# Patient Record
Sex: Female | Born: 1985 | Race: White | Hispanic: No | Marital: Married | Smoking: Former smoker
Health system: Southern US, Community
[De-identification: ages and names within clinical notes are randomized; demographics above are authoritative.]

## PROBLEM LIST (undated history)

## (undated) DIAGNOSIS — N83209 Unspecified ovarian cyst, unspecified side: Secondary | ICD-10-CM

## (undated) HISTORY — PX: OVARIAN CYST REMOVAL: SHX89

## (undated) HISTORY — PX: WISDOM TOOTH EXTRACTION: SHX21

## (undated) HISTORY — DX: Unspecified ovarian cyst, unspecified side: N83.209

---
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
## 2013-08-15 ENCOUNTER — Encounter: Payer: Self-pay | Admitting: Physical Medicine & Rehabilitation

## 2013-09-06 ENCOUNTER — Ambulatory Visit (HOSPITAL_BASED_OUTPATIENT_CLINIC_OR_DEPARTMENT_OTHER): Payer: Worker's Compensation | Admitting: Physical Medicine & Rehabilitation

## 2013-09-06 ENCOUNTER — Encounter: Payer: Worker's Compensation | Attending: Physical Medicine & Rehabilitation

## 2013-09-06 ENCOUNTER — Encounter: Payer: Self-pay | Admitting: Physical Medicine & Rehabilitation

## 2013-09-06 VITALS — BP 124/65 | HR 68 | Resp 14 | Ht 63.0 in | Wt 172.0 lb

## 2013-09-06 DIAGNOSIS — M76899 Other specified enthesopathies of unspecified lower limb, excluding foot: Secondary | ICD-10-CM | POA: Insufficient documentation

## 2013-09-06 DIAGNOSIS — R51 Headache: Secondary | ICD-10-CM | POA: Insufficient documentation

## 2013-09-06 DIAGNOSIS — S39012A Strain of muscle, fascia and tendon of lower back, initial encounter: Secondary | ICD-10-CM | POA: Insufficient documentation

## 2013-09-06 DIAGNOSIS — R519 Headache, unspecified: Secondary | ICD-10-CM | POA: Insufficient documentation

## 2013-09-06 DIAGNOSIS — R209 Unspecified disturbances of skin sensation: Secondary | ICD-10-CM | POA: Insufficient documentation

## 2013-09-06 DIAGNOSIS — S335XXA Sprain of ligaments of lumbar spine, initial encounter: Secondary | ICD-10-CM

## 2013-09-06 DIAGNOSIS — M545 Low back pain: Secondary | ICD-10-CM | POA: Insufficient documentation

## 2013-09-06 DIAGNOSIS — R279 Unspecified lack of coordination: Secondary | ICD-10-CM | POA: Insufficient documentation

## 2013-09-06 DIAGNOSIS — M7060 Trochanteric bursitis, unspecified hip: Secondary | ICD-10-CM | POA: Insufficient documentation

## 2013-09-06 DIAGNOSIS — M7061 Trochanteric bursitis, right hip: Secondary | ICD-10-CM

## 2013-09-06 MED ORDER — MELOXICAM 7.5 MG PO TABS: 7.5000 mg | ORAL_TABLET | Freq: Every day | ORAL | Status: DC

## 2013-09-06 MED ORDER — CYCLOBENZAPRINE HCL 10 MG PO TABS: 10.0000 mg | ORAL_TABLET | Freq: Every day | ORAL | Status: DC
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
## 2013-09-06 NOTE — Patient Instructions (Addendum)

## 2013-09-25 ENCOUNTER — Ambulatory Visit: Payer: Worker's Compensation | Admitting: Physical Medicine & Rehabilitation

## 2013-10-19 ENCOUNTER — Encounter: Payer: Self-pay | Admitting: Physical Medicine & Rehabilitation

## 2013-10-19 ENCOUNTER — Encounter: Payer: Worker's Compensation | Attending: Physical Medicine & Rehabilitation

## 2013-10-19 ENCOUNTER — Ambulatory Visit (HOSPITAL_BASED_OUTPATIENT_CLINIC_OR_DEPARTMENT_OTHER): Payer: Worker's Compensation | Admitting: Physical Medicine & Rehabilitation

## 2013-10-19 ENCOUNTER — Encounter (INDEPENDENT_AMBULATORY_CARE_PROVIDER_SITE_OTHER): Payer: Self-pay

## 2013-10-19 VITALS — BP 117/63 | HR 64 | Resp 14 | Ht 63.0 in | Wt 171.2 lb

## 2013-10-19 DIAGNOSIS — R51 Headache: Secondary | ICD-10-CM | POA: Insufficient documentation

## 2013-10-19 DIAGNOSIS — S39012D Strain of muscle, fascia and tendon of lower back, subsequent encounter: Secondary | ICD-10-CM

## 2013-10-19 DIAGNOSIS — R279 Unspecified lack of coordination: Secondary | ICD-10-CM | POA: Insufficient documentation

## 2013-10-19 DIAGNOSIS — R209 Unspecified disturbances of skin sensation: Secondary | ICD-10-CM | POA: Insufficient documentation

## 2013-10-19 DIAGNOSIS — Z5189 Encounter for other specified aftercare: Secondary | ICD-10-CM

## 2013-10-19 DIAGNOSIS — S335XXA Sprain of ligaments of lumbar spine, initial encounter: Secondary | ICD-10-CM | POA: Insufficient documentation

## 2013-10-19 DIAGNOSIS — M76899 Other specified enthesopathies of unspecified lower limb, excluding foot: Secondary | ICD-10-CM | POA: Insufficient documentation
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
## 2016-09-20 DIAGNOSIS — S161XXA Strain of muscle, fascia and tendon at neck level, initial encounter: Secondary | ICD-10-CM | POA: Diagnosis not present

## 2016-09-20 DIAGNOSIS — S134XXA Sprain of ligaments of cervical spine, initial encounter: Secondary | ICD-10-CM | POA: Diagnosis not present

## 2016-09-20 DIAGNOSIS — M546 Pain in thoracic spine: Secondary | ICD-10-CM | POA: Diagnosis not present

## 2016-09-20 DIAGNOSIS — S233XXA Sprain of ligaments of thoracic spine, initial encounter: Secondary | ICD-10-CM | POA: Diagnosis not present

## 2017-02-15 DIAGNOSIS — Z6829 Body mass index (BMI) 29.0-29.9, adult: Secondary | ICD-10-CM | POA: Diagnosis not present

## 2017-02-15 DIAGNOSIS — N926 Irregular menstruation, unspecified: Secondary | ICD-10-CM | POA: Diagnosis not present

## 2017-02-15 DIAGNOSIS — Z30011 Encounter for initial prescription of contraceptive pills: Secondary | ICD-10-CM | POA: Diagnosis not present

## 2017-03-29 DIAGNOSIS — S233XXA Sprain of ligaments of thoracic spine, initial encounter: Secondary | ICD-10-CM | POA: Diagnosis not present

## 2017-03-29 DIAGNOSIS — S134XXA Sprain of ligaments of cervical spine, initial encounter: Secondary | ICD-10-CM | POA: Diagnosis not present

## 2017-03-29 DIAGNOSIS — S338XXA Sprain of other parts of lumbar spine and pelvis, initial encounter: Secondary | ICD-10-CM | POA: Diagnosis not present

## 2017-05-17 DIAGNOSIS — L720 Epidermal cyst: Secondary | ICD-10-CM | POA: Diagnosis not present

## 2017-07-14 DIAGNOSIS — S233XXA Sprain of ligaments of thoracic spine, initial encounter: Secondary | ICD-10-CM | POA: Diagnosis not present

## 2017-07-14 DIAGNOSIS — S134XXA Sprain of ligaments of cervical spine, initial encounter: Secondary | ICD-10-CM | POA: Diagnosis not present

## 2017-07-14 DIAGNOSIS — S338XXA Sprain of other parts of lumbar spine and pelvis, initial encounter: Secondary | ICD-10-CM | POA: Diagnosis not present

## 2017-08-11 DIAGNOSIS — S134XXA Sprain of ligaments of cervical spine, initial encounter: Secondary | ICD-10-CM | POA: Diagnosis not present

## 2017-08-11 DIAGNOSIS — S233XXA Sprain of ligaments of thoracic spine, initial encounter: Secondary | ICD-10-CM | POA: Diagnosis not present

## 2017-08-11 DIAGNOSIS — S338XXA Sprain of other parts of lumbar spine and pelvis, initial encounter: Secondary | ICD-10-CM | POA: Diagnosis not present

## 2017-09-26 DIAGNOSIS — S134XXA Sprain of ligaments of cervical spine, initial encounter: Secondary | ICD-10-CM | POA: Diagnosis not present

## 2017-09-26 DIAGNOSIS — S233XXA Sprain of ligaments of thoracic spine, initial encounter: Secondary | ICD-10-CM | POA: Diagnosis not present

## 2017-09-26 DIAGNOSIS — S338XXA Sprain of other parts of lumbar spine and pelvis, initial encounter: Secondary | ICD-10-CM | POA: Diagnosis not present
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
## 2017-10-19 ENCOUNTER — Encounter: Payer: Self-pay | Admitting: Family Medicine

## 2017-10-19 ENCOUNTER — Ambulatory Visit (INDEPENDENT_AMBULATORY_CARE_PROVIDER_SITE_OTHER): Payer: BLUE CROSS/BLUE SHIELD | Admitting: Family Medicine

## 2017-10-19 VITALS — BP 107/65 | HR 74 | Temp 98.2°F | Ht 63.0 in | Wt 173.0 lb

## 2017-10-19 DIAGNOSIS — Z9889 Other specified postprocedural states: Secondary | ICD-10-CM

## 2017-10-19 DIAGNOSIS — Z6830 Body mass index (BMI) 30.0-30.9, adult: Secondary | ICD-10-CM | POA: Insufficient documentation

## 2017-10-19 DIAGNOSIS — Z7689 Persons encountering health services in other specified circumstances: Secondary | ICD-10-CM

## 2017-10-19 DIAGNOSIS — Z130 Encounter for screening for diseases of the blood and blood-forming organs and certain disorders involving the immune mechanism: Secondary | ICD-10-CM

## 2017-10-19 DIAGNOSIS — Z0000 Encounter for general adult medical examination without abnormal findings: Secondary | ICD-10-CM

## 2017-10-19 DIAGNOSIS — Z8742 Personal history of other diseases of the female genital tract: Secondary | ICD-10-CM

## 2017-10-19 DIAGNOSIS — R1031 Right lower quadrant pain: Secondary | ICD-10-CM | POA: Diagnosis not present

## 2017-10-19 DIAGNOSIS — Z2821 Immunization not carried out because of patient refusal: Secondary | ICD-10-CM

## 2017-10-19 LAB — MICROSCOPIC EXAMINATION
Bacteria, UA: NONE SEEN
RBC, UA: NONE SEEN /hpf (ref 0–?)
Renal Epithel, UA: NONE SEEN /hpf
WBC, UA: NONE SEEN /hpf (ref 0–?)

## 2017-10-19 LAB — URINALYSIS, COMPLETE
BILIRUBIN UA: NEGATIVE
Glucose, UA: NEGATIVE
KETONES UA: NEGATIVE
LEUKOCYTES UA: NEGATIVE
Nitrite, UA: NEGATIVE
Protein, UA: NEGATIVE
RBC UA: NEGATIVE
SPEC GRAV UA: 1.01 (ref 1.005–1.030)
Urobilinogen, Ur: 0.2 mg/dL (ref 0.2–1.0)
pH, UA: 7 (ref 5.0–7.5)

## 2017-10-19 LAB — PREGNANCY, URINE: PREG TEST UR: NEGATIVE

## 2017-10-19 NOTE — Patient Instructions (Signed)
We will go ahead and get an ultrasound to rule out ovarian cyst.  I have also ordered a urinalysis urine pregnancy to rule out infection and/or pregnancy as possible causes.  He will be contacted to schedule the ultrasound.  I do recommend going ahead and schedule an appointment with your gynecologist.  Get blood labs when you are fasting.  Health Maintenance, Female Adopting a healthy lifestyle and getting preventive care can go a long way to promote health and wellness. Talk with your health care provider about what schedule of regular examinations is right for you. This is a good chance for you to check in with your provider about disease prevention and staying healthy. In between checkups, there are plenty of things you can do on your own. Experts have done a lot of research about which lifestyle changes and preventive measures are most likely to keep you healthy. Ask your health care provider for more information. Weight and diet Eat a healthy diet  Be sure to include plenty of vegetables, fruits, low-fat dairy products, and lean protein.  Do not eat a lot of foods high in solid fats, added sugars, or salt.  Get regular exercise. This is one of the most important things you can do for your health. ? Most adults should exercise for at least 150 minutes each week. The exercise should increase your heart rate and make you sweat (moderate-intensity exercise). ? Most adults should also do strengthening exercises at least twice a week. This is in addition to the moderate-intensity exercise.  Maintain a healthy weight  Body mass index (BMI) is a measurement that can be used to identify possible weight problems. It estimates body fat based on height and weight. Your health care provider can help determine your BMI and help you achieve or maintain a healthy weight.  For females 33 years of age and older: ? A BMI below 18.5 is considered underweight. ? A BMI of 18.5 to 24.9 is normal. ? A BMI of  25 to 29.9 is considered overweight. ? A BMI of 30 and above is considered obese.  Watch levels of cholesterol and blood lipids  You should start having your blood tested for lipids and cholesterol at 30 years of age, then have this test every 5 years.  You may need to have your cholesterol levels checked more often if: ? Your lipid or cholesterol levels are high. ? You are older than 30 years of age. ? You are at high risk for heart disease.  Cancer screening Lung Cancer  Lung cancer screening is recommended for adults 63-80 years old who are at high risk for lung cancer because of a history of smoking.  A yearly low-dose CT scan of the lungs is recommended for people who: ? Currently smoke. ? Have quit within the past 15 years. ? Have at least a 30-pack-year history of smoking. A pack year is smoking an average of one pack of cigarettes a day for 1 year.  Yearly screening should continue until it has been 15 years since you quit.  Yearly screening should stop if you develop a health problem that would prevent you from having lung cancer treatment.  Breast Cancer  Practice breast self-awareness. This means understanding how your breasts normally appear and feel.  It also means doing regular breast self-exams. Let your health care provider know about any changes, no matter how small.  If you are in your 20s or 30s, you should have a clinical breast exam (CBE)  by a health care provider every 1-3 years as part of a regular health exam.  If you are 4 or older, have a CBE every year. Also consider having a breast X-ray (mammogram) every year.  If you have a family history of breast cancer, talk to your health care provider about genetic screening.  If you are at high risk for breast cancer, talk to your health care provider about having an MRI and a mammogram every year.  Breast cancer gene (BRCA) assessment is recommended for women who have family members with BRCA-related  cancers. BRCA-related cancers include: ? Breast. ? Ovarian. ? Tubal. ? Peritoneal cancers.  Results of the assessment will determine the need for genetic counseling and BRCA1 and BRCA2 testing.  Cervical Cancer Your health care provider may recommend that you be screened regularly for cancer of the pelvic organs (ovaries, uterus, and vagina). This screening involves a pelvic examination, including checking for microscopic changes to the surface of your cervix (Pap test). You may be encouraged to have this screening done every 3 years, beginning at age 49.  For women ages 5-65, health care providers may recommend pelvic exams and Pap testing every 3 years, or they may recommend the Pap and pelvic exam, combined with testing for human papilloma virus (HPV), every 5 years. Some types of HPV increase your risk of cervical cancer. Testing for HPV may also be done on women of any age with unclear Pap test results.  Other health care providers may not recommend any screening for nonpregnant women who are considered low risk for pelvic cancer and who do not have symptoms. Ask your health care provider if a screening pelvic exam is right for you.  If you have had past treatment for cervical cancer or a condition that could lead to cancer, you need Pap tests and screening for cancer for at least 20 years after your treatment. If Pap tests have been discontinued, your risk factors (such as having a new sexual partner) need to be reassessed to determine if screening should resume. Some women have medical problems that increase the chance of getting cervical cancer. In these cases, your health care provider may recommend more frequent screening and Pap tests.  Colorectal Cancer  This type of cancer can be detected and often prevented.  Routine colorectal cancer screening usually begins at 30 years of age and continues through 30 years of age.  Your health care provider may recommend screening at an  earlier age if you have risk factors for colon cancer.  Your health care provider may also recommend using home test kits to check for hidden blood in the stool.  A small camera at the end of a tube can be used to examine your colon directly (sigmoidoscopy or colonoscopy). This is done to check for the earliest forms of colorectal cancer.  Routine screening usually begins at age 53.  Direct examination of the colon should be repeated every 5-10 years through 30 years of age. However, you may need to be screened more often if early forms of precancerous polyps or small growths are found.  Skin Cancer  Check your skin from head to toe regularly.  Tell your health care provider about any new moles or changes in moles, especially if there is a change in a mole's shape or color.  Also tell your health care provider if you have a mole that is larger than the size of a pencil eraser.  Always use sunscreen. Apply sunscreen liberally and  repeatedly throughout the day.  Protect yourself by wearing long sleeves, pants, a wide-brimmed hat, and sunglasses whenever you are outside.  Heart disease, diabetes, and high blood pressure  High blood pressure causes heart disease and increases the risk of stroke. High blood pressure is more likely to develop in: ? People who have blood pressure in the high end of the normal range (130-139/85-89 mm Hg). ? People who are overweight or obese. ? People who are African American.  If you are 33-38 years of age, have your blood pressure checked every 3-5 years. If you are 76 years of age or older, have your blood pressure checked every year. You should have your blood pressure measured twice--once when you are at a hospital or clinic, and once when you are not at a hospital or clinic. Record the average of the two measurements. To check your blood pressure when you are not at a hospital or clinic, you can use: ? An automated blood pressure machine at a  pharmacy. ? A home blood pressure monitor.  If you are between 78 years and 36 years old, ask your health care provider if you should take aspirin to prevent strokes.  Have regular diabetes screenings. This involves taking a blood sample to check your fasting blood sugar level. ? If you are at a normal weight and have a low risk for diabetes, have this test once every three years after 30 years of age. ? If you are overweight and have a high risk for diabetes, consider being tested at a younger age or more often. Preventing infection Hepatitis B  If you have a higher risk for hepatitis B, you should be screened for this virus. You are considered at high risk for hepatitis B if: ? You were born in a country where hepatitis B is common. Ask your health care provider which countries are considered high risk. ? Your parents were born in a high-risk country, and you have not been immunized against hepatitis B (hepatitis B vaccine). ? You have HIV or AIDS. ? You use needles to inject street drugs. ? You live with someone who has hepatitis B. ? You have had sex with someone who has hepatitis B. ? You get hemodialysis treatment. ? You take certain medicines for conditions, including cancer, organ transplantation, and autoimmune conditions.  Hepatitis C  Blood testing is recommended for: ? Everyone born from [birth year] through 1964. ? Anyone with known risk factors for hepatitis C.  Sexually transmitted infections (STIs)  You should be screened for sexually transmitted infections (STIs) including gonorrhea and chlamydia if: ? You are sexually active and are younger than 30 years of age. ? You are older than 30 years of age and your health care provider tells you that you are at risk for this type of infection. ? Your sexual activity has changed since you were last screened and you are at an increased risk for chlamydia or gonorrhea. Ask your health care provider if you are at risk.  If you do not  have HIV, but are at risk, it may be recommended that you take a prescription medicine daily to prevent HIV infection. This is called pre-exposure prophylaxis (PrEP). You are considered at risk if: ? You are sexually active and do not regularly use condoms or know the HIV status of your partner(s). ? You take drugs by injection. ? You are sexually active with a partner who has HIV.  Talk with your health care provider about whether you  are at high risk of being infected with HIV. If you choose to begin PrEP, you should first be tested for HIV. You should then be tested every 3 months for as long as you are taking PrEP. Pregnancy  If you are premenopausal and you may become pregnant, ask your health care provider about preconception counseling.  If you may become pregnant, take 400 to 800 micrograms (mcg) of folic acid every day.  If you want to prevent pregnancy, talk to your health care provider about birth control (contraception). Osteoporosis and menopause  Osteoporosis is a disease in which the bones lose minerals and strength with aging. This can result in serious bone fractures. Your risk for osteoporosis can be identified using a bone density scan.  If you are 5 years of age or older, or if you are at risk for osteoporosis and fractures, ask your health care provider if you should be screened.  Ask your health care provider whether you should take a calcium or vitamin D supplement to lower your risk for osteoporosis.  Menopause may have certain physical symptoms and risks.  Hormone replacement therapy may reduce some of these symptoms and risks. Talk to your health care provider about whether hormone replacement therapy is right for you. Follow these instructions at home:  Schedule regular health, dental, and eye exams.  Stay current with your immunizations.  Do not use any tobacco products including cigarettes, chewing tobacco, or electronic cigarettes.  If you are pregnant,  do not drink alcohol.  If you are breastfeeding, limit how much and how often you drink alcohol.  Limit alcohol intake to no more than 1 drink per day for nonpregnant women. One drink equals 12 ounces of beer, 5 ounces of wine, or 1 ounces of hard liquor.  Do not use street drugs.  Do not share needles.  Ask your health care provider for help if you need support or information about quitting drugs.  Tell your health care provider if you often feel depressed.  Tell your health care provider if you have ever been abused or do not feel safe at home. This information is not intended to replace advice given to you by your health care provider. Make sure you discuss any questions you have with your health care provider. Document Released: 06/28/2011 Document Revised: 05/20/2016 Document Reviewed: 09/16/2015 Elsevier Interactive Patient Education  [employer].

## 2017-10-25 ENCOUNTER — Ambulatory Visit (HOSPITAL_COMMUNITY): Payer: BLUE CROSS/BLUE SHIELD

## 2018-01-16 DIAGNOSIS — S233XXA Sprain of ligaments of thoracic spine, initial encounter: Secondary | ICD-10-CM | POA: Diagnosis not present

## 2018-01-16 DIAGNOSIS — S134XXA Sprain of ligaments of cervical spine, initial encounter: Secondary | ICD-10-CM | POA: Diagnosis not present

## 2018-01-16 DIAGNOSIS — S338XXA Sprain of other parts of lumbar spine and pelvis, initial encounter: Secondary | ICD-10-CM | POA: Diagnosis not present

## 2018-01-17 DIAGNOSIS — Z01419 Encounter for gynecological examination (general) (routine) without abnormal findings: Secondary | ICD-10-CM | POA: Diagnosis not present

## 2018-01-17 DIAGNOSIS — Z6832 Body mass index (BMI) 32.0-32.9, adult: Secondary | ICD-10-CM | POA: Diagnosis not present

## 2018-01-17 DIAGNOSIS — Z3009 Encounter for other general counseling and advice on contraception: Secondary | ICD-10-CM | POA: Diagnosis not present

## 2018-02-07 DIAGNOSIS — Z30014 Encounter for initial prescription of intrauterine contraceptive device: Secondary | ICD-10-CM | POA: Diagnosis not present

## 2018-02-07 DIAGNOSIS — Z3202 Encounter for pregnancy test, result negative: Secondary | ICD-10-CM | POA: Diagnosis not present

## 2018-02-07 DIAGNOSIS — Z30430 Encounter for insertion of intrauterine contraceptive device: Secondary | ICD-10-CM | POA: Diagnosis not present

## 2018-02-13 DIAGNOSIS — S338XXA Sprain of other parts of lumbar spine and pelvis, initial encounter: Secondary | ICD-10-CM | POA: Diagnosis not present

## 2018-02-13 DIAGNOSIS — S134XXA Sprain of ligaments of cervical spine, initial encounter: Secondary | ICD-10-CM | POA: Diagnosis not present

## 2018-02-13 DIAGNOSIS — S233XXA Sprain of ligaments of thoracic spine, initial encounter: Secondary | ICD-10-CM | POA: Diagnosis not present

## 2018-03-10 DIAGNOSIS — Z6830 Body mass index (BMI) 30.0-30.9, adult: Secondary | ICD-10-CM | POA: Diagnosis not present

## 2018-03-10 DIAGNOSIS — Z30431 Encounter for routine checking of intrauterine contraceptive device: Secondary | ICD-10-CM | POA: Diagnosis not present

## 2018-03-16 DIAGNOSIS — S338XXA Sprain of other parts of lumbar spine and pelvis, initial encounter: Secondary | ICD-10-CM | POA: Diagnosis not present

## 2018-03-16 DIAGNOSIS — S233XXA Sprain of ligaments of thoracic spine, initial encounter: Secondary | ICD-10-CM | POA: Diagnosis not present

## 2018-03-16 DIAGNOSIS — S134XXA Sprain of ligaments of cervical spine, initial encounter: Secondary | ICD-10-CM | POA: Diagnosis not present

## 2018-04-13 DIAGNOSIS — S134XXA Sprain of ligaments of cervical spine, initial encounter: Secondary | ICD-10-CM | POA: Diagnosis not present

## 2018-04-13 DIAGNOSIS — S338XXA Sprain of other parts of lumbar spine and pelvis, initial encounter: Secondary | ICD-10-CM | POA: Diagnosis not present

## 2018-04-13 DIAGNOSIS — S233XXA Sprain of ligaments of thoracic spine, initial encounter: Secondary | ICD-10-CM | POA: Diagnosis not present

## 2018-05-11 DIAGNOSIS — S233XXA Sprain of ligaments of thoracic spine, initial encounter: Secondary | ICD-10-CM | POA: Diagnosis not present

## 2018-05-11 DIAGNOSIS — S338XXA Sprain of other parts of lumbar spine and pelvis, initial encounter: Secondary | ICD-10-CM | POA: Diagnosis not present

## 2018-05-11 DIAGNOSIS — S134XXA Sprain of ligaments of cervical spine, initial encounter: Secondary | ICD-10-CM | POA: Diagnosis not present

## 2018-06-01 DIAGNOSIS — S233XXA Sprain of ligaments of thoracic spine, initial encounter: Secondary | ICD-10-CM | POA: Diagnosis not present

## 2018-06-01 DIAGNOSIS — S338XXA Sprain of other parts of lumbar spine and pelvis, initial encounter: Secondary | ICD-10-CM | POA: Diagnosis not present

## 2018-06-01 DIAGNOSIS — S134XXA Sprain of ligaments of cervical spine, initial encounter: Secondary | ICD-10-CM | POA: Diagnosis not present

## 2018-07-05 DIAGNOSIS — S233XXA Sprain of ligaments of thoracic spine, initial encounter: Secondary | ICD-10-CM | POA: Diagnosis not present

## 2018-07-05 DIAGNOSIS — S338XXA Sprain of other parts of lumbar spine and pelvis, initial encounter: Secondary | ICD-10-CM | POA: Diagnosis not present

## 2018-07-05 DIAGNOSIS — S134XXA Sprain of ligaments of cervical spine, initial encounter: Secondary | ICD-10-CM | POA: Diagnosis not present

## 2018-08-03 DIAGNOSIS — S338XXA Sprain of other parts of lumbar spine and pelvis, initial encounter: Secondary | ICD-10-CM | POA: Diagnosis not present

## 2018-08-03 DIAGNOSIS — S134XXA Sprain of ligaments of cervical spine, initial encounter: Secondary | ICD-10-CM | POA: Diagnosis not present

## 2018-08-03 DIAGNOSIS — S233XXA Sprain of ligaments of thoracic spine, initial encounter: Secondary | ICD-10-CM | POA: Diagnosis not present

## 2018-08-17 DIAGNOSIS — S233XXA Sprain of ligaments of thoracic spine, initial encounter: Secondary | ICD-10-CM | POA: Diagnosis not present

## 2018-08-17 DIAGNOSIS — S134XXA Sprain of ligaments of cervical spine, initial encounter: Secondary | ICD-10-CM | POA: Diagnosis not present

## 2018-08-17 DIAGNOSIS — S338XXA Sprain of other parts of lumbar spine and pelvis, initial encounter: Secondary | ICD-10-CM | POA: Diagnosis not present

## 2018-08-31 DIAGNOSIS — S134XXA Sprain of ligaments of cervical spine, initial encounter: Secondary | ICD-10-CM | POA: Diagnosis not present

## 2018-08-31 DIAGNOSIS — S338XXA Sprain of other parts of lumbar spine and pelvis, initial encounter: Secondary | ICD-10-CM | POA: Diagnosis not present

## 2018-08-31 DIAGNOSIS — S233XXA Sprain of ligaments of thoracic spine, initial encounter: Secondary | ICD-10-CM | POA: Diagnosis not present

## 2018-09-28 DIAGNOSIS — S134XXA Sprain of ligaments of cervical spine, initial encounter: Secondary | ICD-10-CM | POA: Diagnosis not present

## 2018-09-28 DIAGNOSIS — S233XXA Sprain of ligaments of thoracic spine, initial encounter: Secondary | ICD-10-CM | POA: Diagnosis not present

## 2018-09-28 DIAGNOSIS — S338XXA Sprain of other parts of lumbar spine and pelvis, initial encounter: Secondary | ICD-10-CM | POA: Diagnosis not present

## 2018-11-10 DIAGNOSIS — S233XXA Sprain of ligaments of thoracic spine, initial encounter: Secondary | ICD-10-CM | POA: Diagnosis not present

## 2018-11-10 DIAGNOSIS — S134XXA Sprain of ligaments of cervical spine, initial encounter: Secondary | ICD-10-CM | POA: Diagnosis not present

## 2018-11-10 DIAGNOSIS — S338XXA Sprain of other parts of lumbar spine and pelvis, initial encounter: Secondary | ICD-10-CM | POA: Diagnosis not present

## 2018-11-14 ENCOUNTER — Encounter: Payer: Self-pay | Admitting: Family Medicine

## 2018-11-14 ENCOUNTER — Ambulatory Visit (INDEPENDENT_AMBULATORY_CARE_PROVIDER_SITE_OTHER): Payer: BLUE CROSS/BLUE SHIELD | Admitting: Family Medicine

## 2018-11-14 VITALS — BP 124/70 | HR 66 | Temp 98.3°F | Ht 63.0 in | Wt 166.0 lb

## 2018-11-14 DIAGNOSIS — H81312 Aural vertigo, left ear: Secondary | ICD-10-CM

## 2018-11-14 MED ORDER — MECLIZINE HCL 12.5 MG PO TABS: 12.5000 mg | ORAL_TABLET | Freq: Three times a day (TID) | ORAL | 0 refills | Status: DC | PRN

## 2018-11-14 NOTE — Patient Instructions (Signed)
Continue doing the maneuver to the left for vertigo.  Again, there was no evidence of infection or trauma to the inner ear.  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  I have sent in a small quantity of meclizine to use if needed.  Make sure you are staying hydrated.  Avoid aggravating activities.  We discussed that if symptoms do not continue to be better or do not totally resolve it may be a good idea to see the vestibular physical therapist.  Vertigo Vertigo is the feeling that you or your surroundings are moving when they are not. Vertigo can be dangerous if it occurs while you are doing something that could endanger you or others, such as driving. What are the causes? This condition is caused by a disturbance in the signals that are sent by your bodys sensory systems to your brain. Different causes of a disturbance can lead to vertigo, including:  Infections, especially in the inner ear.  A bad reaction to a drug, or misuse of alcohol and medicines.  Withdrawal from drugs or alcohol.  Quickly changing positions, as when lying down or rolling over in bed.  Migraine headaches.  Decreased blood flow to the brain.  Decreased blood pressure.  Increased pressure in the brain from a head or neck injury, stroke, infection, tumor, or bleeding.  Central nervous system disorders.  What are the signs or symptoms? Symptoms of this condition usually occur when you move your head or your eyes in different directions. Symptoms may start suddenly, and they usually last for less than a minute. Symptoms may include:  Loss of balance and falling.  Feeling like you are spinning or moving.  Feeling like your surroundings are spinning or moving.  Nausea and vomiting.  Blurred vision or double vision.  Difficulty hearing.  Slurred speech.  Dizziness.  Involuntary eye movement (nystagmus).  Symptoms can be mild and cause only slight annoyance, or they can be severe and  interfere with daily life. Episodes of vertigo may return (recur) over time, and they are often triggered by certain movements. Symptoms may improve over time. How is this diagnosed? This condition may be diagnosed based on medical history and the quality of your nystagmus. Your health care provider may test your eye movements by asking you to quickly change positions to trigger the nystagmus. This may be called the Dix-Hallpike test, head thrust test, or roll test. You may be referred to a health care provider who specializes in ear, nose, and throat (ENT) problems (otolaryngologist) or a provider who specializes in disorders of the central nervous system (neurologist). You may have additional testing, including:  A physical exam.  Blood tests.  MRI.  A CT scan.  An electrocardiogram (ECG). This records electrical activity in your heart.  An electroencephalogram (EEG). This records electrical activity in your brain.  Hearing tests.  How is this treated? Treatment for this condition depends on the cause and the severity of the symptoms. Treatment options include:  Medicines to treat nausea or vertigo. These are usually used for severe cases. Some medicines that are used to treat other conditions may also reduce or eliminate vertigo symptoms. These include: ? Medicines that control allergies (antihistamines). ? Medicines that control seizures (anticonvulsants). ? Medicines that relieve depression (antidepressants). ? Medicines that relieve anxiety (sedatives).  Head movements to adjust your inner ear back to normal. If your vertigo is caused by an ear problem, your health care provider may recommend certain movements to correct the problem.  Surgery. This is rare.  Follow these instructions at home: Safety  Move slowly.Avoid sudden body or head movements.  Avoid driving.  Avoid operating heavy machinery.  Avoid doing any tasks that would cause danger to you or others if you  would have a vertigo episode during the task.  If you have trouble walking or keeping your balance, try using a cane for stability. If you feel dizzy or unstable, sit down right away.  Return to your normal activities as told by your health care provider. Ask your health care provider what activities are safe for you. General instructions  Take over-the-counter and prescription medicines only as told by your health care provider.  Avoid certain positions or movements as told by your health care provider.  Drink enough fluid to keep your urine clear or pale yellow.  Keep all follow-up visits as told by your health care provider. This is important. Contact a health care provider if:  Your medicines do not relieve your vertigo or they make it worse.  You have a fever.  Your condition gets worse or you develop new symptoms.  Your family or friends notice any behavioral changes.  Your nausea or vomiting gets worse.  You have numbness or a pins and needles sensation in part of your body. Get help right away if:  You have difficulty moving or speaking.  You are always dizzy.  You faint.  You develop severe headaches.  You have weakness in your hands, arms, or legs.  You have changes in your hearing or vision.  You develop a stiff neck.  You develop sensitivity to light. This information is not intended to replace advice given to you by your health care provider. Make sure you discuss any questions you have with your health care provider. Document Released: 09/22/2005 Document Revised: 05/26/2016 Document Reviewed: 04/07/2015 Elsevier Interactive Patient Education  [employer].

## 2018-11-14 NOTE — Progress Notes (Signed)
Subjective: CC: Vertigo PCP: Raliegh Ip, DO QIO:NGEX Prowant is a 32 y.o. female presenting to clinic today for:  1. Vertigo Patient reports onset of vertigo about a week ago.  She notes that it seemed to be bilateral with left being worse than right but she has been performing the Epley maneuver at home and the right has totally improved.  The left is improving but still present.  She wanted to make sure that she does not have any infection ongoing in the left ear or trauma to the left ear.  She reports being very physically active and has had some impact on the left side of the head during sparring.  She notes that she wears headgear.  Denies any visual disturbance, nausea, vomiting.  She has had some balance problems but this resolves after she is able to acclimate the body for a few minutes.  She saw a chiropractor who attempted to perform the Epley as well.   ROS: Per HPI  Allergies  Allergen Reactions  . Azithromycin Nausea And Vomiting  . Demerol [Meperidine] Nausea And Vomiting   Past Medical History:  Diagnosis Date  . Ovarian cyst    right side   No current outpatient medications on file. Social History   Socioeconomic History  . Marital status: Married    Spouse name: Not on file  . Number of children: Not on file  . Years of education: Not on file  . Highest education level: Not on file  Occupational History  . Not on file  Social Needs  . Financial resource strain: Not on file  . Food insecurity:    Worry: Not on file    Inability: Not on file  . Transportation needs:    Medical: Not on file    Non-medical: Not on file  Tobacco Use  . Smoking status: Former Smoker    Packs/day: 0.50    Years: 5.00    Pack years: 2.50    Types: Cigarettes    Last attempt to quit: 2013    Years since quitting: 6.8  . Smokeless tobacco: Never Used  Substance and Sexual Activity  . Alcohol use: Yes    Comment: occassionally  . Drug use: No  . Sexual  activity: Yes    Birth control/protection: Condom  Lifestyle  . Physical activity:    Days per week: Not on file    Minutes per session: Not on file  . Stress: Not on file  Relationships  . Social connections:    Talks on phone: Not on file    Gets together: Not on file    Attends religious service: Not on file    Active member of club or organization: Not on file    Attends meetings of clubs or organizations: Not on file    Relationship status: Not on file  . Intimate partner violence:    Fear of current or ex partner: Not on file    Emotionally abused: Not on file    Physically abused: Not on file    Forced sexual activity: Not on file  Other Topics Concern  . Not on file  Social History Narrative  . Not on file   Family History  Problem Relation Age of Onset  . Heart disease Maternal Grandmother 60  . Hyperlipidemia Maternal Grandmother   . Hypertension Maternal Grandmother   . Diabetes Maternal Grandmother   . Alcohol abuse Paternal Grandmother   . Heart disease Paternal Grandfather  Objective: Office vital signs reviewed. BP 124/70   Pulse 66   Temp 98.3 F (36.8 C) (Oral)   Ht 5\' 3"  (1.6 m)   Wt 166 lb (75.3 kg)   BMI 29.41 kg/m   Physical Examination:  General: Awake, alert, well nourished, No acute distress HEENT: Normal    Neck: No masses palpated. No lymphadenopathy    Ears: Tympanic membranes intact, normal light reflex, no erythema, no bulging; no hemotympanum.  Scant dry skin in the left external auditory canal.    Eyes: PERRLA, extraocular membranes intact, sclera white Neuro: No focal neurologic deficits.  No nystagmus noted.  Assessment/ Plan: 32 y.o. female   1. 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  I have given her the handout for Epley maneuver to perform for the left side.  Push oral fluids.  Use meclizine.  Since symptoms are improving, will hold off on referral to vestibular rehab.  However, we discussed reasons to call or be  seen again.  She voiced good understanding of follow-up PRN.   No orders of the defined types were placed in this encounter.  Meds ordered this encounter  Medications  . meclizine (ANTIVERT) 12.5 MG tablet    Sig: Take 1-2 tablets (12.5-25 mg total) by mouth 3 (three) times daily as needed for dizziness.    Dispense:  30 tablet    Refill:  0     Danaya Geddis Hulen Skains, DO [facility] [phone]

## 2018-12-01 DIAGNOSIS — S134XXA Sprain of ligaments of cervical spine, initial encounter: Secondary | ICD-10-CM | POA: Diagnosis not present

## 2018-12-01 DIAGNOSIS — S233XXA Sprain of ligaments of thoracic spine, initial encounter: Secondary | ICD-10-CM | POA: Diagnosis not present

## 2018-12-01 DIAGNOSIS — S338XXA Sprain of other parts of lumbar spine and pelvis, initial encounter: Secondary | ICD-10-CM | POA: Diagnosis not present

## 2019-01-10 DIAGNOSIS — L700 Acne vulgaris: Secondary | ICD-10-CM | POA: Diagnosis not present

## 2019-01-26 DIAGNOSIS — S338XXA Sprain of other parts of lumbar spine and pelvis, initial encounter: Secondary | ICD-10-CM | POA: Diagnosis not present

## 2019-01-26 DIAGNOSIS — S134XXA Sprain of ligaments of cervical spine, initial encounter: Secondary | ICD-10-CM | POA: Diagnosis not present

## 2019-01-26 DIAGNOSIS — S233XXA Sprain of ligaments of thoracic spine, initial encounter: Secondary | ICD-10-CM | POA: Diagnosis not present

## 2019-02-13 DIAGNOSIS — S134XXA Sprain of ligaments of cervical spine, initial encounter: Secondary | ICD-10-CM | POA: Diagnosis not present

## 2019-02-13 DIAGNOSIS — S233XXA Sprain of ligaments of thoracic spine, initial encounter: Secondary | ICD-10-CM | POA: Diagnosis not present

## 2019-02-13 DIAGNOSIS — S338XXA Sprain of other parts of lumbar spine and pelvis, initial encounter: Secondary | ICD-10-CM | POA: Diagnosis not present

## 2019-04-26 DIAGNOSIS — S134XXA Sprain of ligaments of cervical spine, initial encounter: Secondary | ICD-10-CM | POA: Diagnosis not present

## 2019-04-26 DIAGNOSIS — S338XXA Sprain of other parts of lumbar spine and pelvis, initial encounter: Secondary | ICD-10-CM | POA: Diagnosis not present

## 2019-04-26 DIAGNOSIS — S233XXA Sprain of ligaments of thoracic spine, initial encounter: Secondary | ICD-10-CM | POA: Diagnosis not present

## 2019-05-10 DIAGNOSIS — S338XXA Sprain of other parts of lumbar spine and pelvis, initial encounter: Secondary | ICD-10-CM | POA: Diagnosis not present

## 2019-05-10 DIAGNOSIS — S134XXA Sprain of ligaments of cervical spine, initial encounter: Secondary | ICD-10-CM | POA: Diagnosis not present

## 2019-05-10 DIAGNOSIS — S233XXA Sprain of ligaments of thoracic spine, initial encounter: Secondary | ICD-10-CM | POA: Diagnosis not present

## 2019-05-29 DIAGNOSIS — S134XXA Sprain of ligaments of cervical spine, initial encounter: Secondary | ICD-10-CM | POA: Diagnosis not present

## 2019-05-29 DIAGNOSIS — S338XXA Sprain of other parts of lumbar spine and pelvis, initial encounter: Secondary | ICD-10-CM | POA: Diagnosis not present

## 2019-05-29 DIAGNOSIS — S233XXA Sprain of ligaments of thoracic spine, initial encounter: Secondary | ICD-10-CM | POA: Diagnosis not present

## 2019-05-31 DIAGNOSIS — S233XXA Sprain of ligaments of thoracic spine, initial encounter: Secondary | ICD-10-CM | POA: Diagnosis not present

## 2019-05-31 DIAGNOSIS — S134XXA Sprain of ligaments of cervical spine, initial encounter: Secondary | ICD-10-CM | POA: Diagnosis not present

## 2019-05-31 DIAGNOSIS — S338XXA Sprain of other parts of lumbar spine and pelvis, initial encounter: Secondary | ICD-10-CM | POA: Diagnosis not present

## 2019-06-22 DIAGNOSIS — S233XXA Sprain of ligaments of thoracic spine, initial encounter: Secondary | ICD-10-CM | POA: Diagnosis not present

## 2019-06-22 DIAGNOSIS — S338XXA Sprain of other parts of lumbar spine and pelvis, initial encounter: Secondary | ICD-10-CM | POA: Diagnosis not present

## 2019-06-22 DIAGNOSIS — S134XXA Sprain of ligaments of cervical spine, initial encounter: Secondary | ICD-10-CM | POA: Diagnosis not present

## 2019-07-10 DIAGNOSIS — S233XXA Sprain of ligaments of thoracic spine, initial encounter: Secondary | ICD-10-CM | POA: Diagnosis not present

## 2019-07-10 DIAGNOSIS — S134XXA Sprain of ligaments of cervical spine, initial encounter: Secondary | ICD-10-CM | POA: Diagnosis not present

## 2019-07-10 DIAGNOSIS — S338XXA Sprain of other parts of lumbar spine and pelvis, initial encounter: Secondary | ICD-10-CM | POA: Diagnosis not present

## 2019-07-19 DIAGNOSIS — S233XXA Sprain of ligaments of thoracic spine, initial encounter: Secondary | ICD-10-CM | POA: Diagnosis not present

## 2019-07-19 DIAGNOSIS — S338XXA Sprain of other parts of lumbar spine and pelvis, initial encounter: Secondary | ICD-10-CM | POA: Diagnosis not present

## 2019-07-19 DIAGNOSIS — S134XXA Sprain of ligaments of cervical spine, initial encounter: Secondary | ICD-10-CM | POA: Diagnosis not present

## 2019-08-06 DIAGNOSIS — S134XXA Sprain of ligaments of cervical spine, initial encounter: Secondary | ICD-10-CM | POA: Diagnosis not present

## 2019-08-06 DIAGNOSIS — S233XXA Sprain of ligaments of thoracic spine, initial encounter: Secondary | ICD-10-CM | POA: Diagnosis not present

## 2019-08-06 DIAGNOSIS — S338XXA Sprain of other parts of lumbar spine and pelvis, initial encounter: Secondary | ICD-10-CM | POA: Diagnosis not present

## 2019-09-21 DIAGNOSIS — Z30431 Encounter for routine checking of intrauterine contraceptive device: Secondary | ICD-10-CM | POA: Diagnosis not present

## 2019-09-21 DIAGNOSIS — N921 Excessive and frequent menstruation with irregular cycle: Secondary | ICD-10-CM | POA: Diagnosis not present

## 2019-09-21 DIAGNOSIS — Z6830 Body mass index (BMI) 30.0-30.9, adult: Secondary | ICD-10-CM | POA: Diagnosis not present

## 2019-10-09 DIAGNOSIS — Z30431 Encounter for routine checking of intrauterine contraceptive device: Secondary | ICD-10-CM | POA: Diagnosis not present

## 2019-10-09 DIAGNOSIS — N83202 Unspecified ovarian cyst, left side: Secondary | ICD-10-CM | POA: Diagnosis not present

## 2019-10-09 DIAGNOSIS — Z6829 Body mass index (BMI) 29.0-29.9, adult: Secondary | ICD-10-CM | POA: Diagnosis not present

## 2019-10-09 DIAGNOSIS — N83292 Other ovarian cyst, left side: Secondary | ICD-10-CM | POA: Diagnosis not present

## 2019-10-09 DIAGNOSIS — N921 Excessive and frequent menstruation with irregular cycle: Secondary | ICD-10-CM | POA: Diagnosis not present

## 2019-12-17 ENCOUNTER — Ambulatory Visit (INDEPENDENT_AMBULATORY_CARE_PROVIDER_SITE_OTHER): Payer: Self-pay | Admitting: Family

## 2019-12-17 ENCOUNTER — Encounter: Payer: Self-pay | Admitting: Family

## 2019-12-17 DIAGNOSIS — M5441 Lumbago with sciatica, right side: Secondary | ICD-10-CM

## 2019-12-17 MED ORDER — GABAPENTIN 100 MG PO CAPS: 100.0000 mg | ORAL_CAPSULE | Freq: Three times a day (TID) | ORAL | 3 refills | Status: DC

## 2019-12-17 MED ORDER — DICLOFENAC SODIUM 75 MG PO TBEC: 75.0000 mg | DELAYED_RELEASE_TABLET | Freq: Two times a day (BID) | ORAL | 0 refills | Status: DC

## 2019-12-17 MED ORDER — PREDNISONE 10 MG (21) PO TBPK: ORAL_TABLET | ORAL | 0 refills | Status: DC

## 2019-12-17 NOTE — Progress Notes (Signed)
Virtual Visit via telephone Note Due to COVID-19 pandemic this visit was conducted virtually. This visit type was conducted due to national recommendations for restrictions regarding the COVID-19 Pandemic (e.g. social distancing, sheltering in place) in an effort to limit this patient's exposure and mitigate transmission in our community. All issues noted in this document were discussed and addressed.  A physical exam was not performed with this format.  I connected with Tabitha Trujillo on 12/17/19 at 2:17 pm by telephone and verified that I am speaking with the correct person using two identifiers. Tabitha Trujillo is currently located at home and son is currently with her during visit. The provider, Evelina Dun, FNP is located in their office at time of visit.  I discussed the limitations, risks, security and privacy concerns of performing an evaluation and management service by telephone and the availability of in person appointments. I also discussed with the patient that there may be a patient responsible charge related to this service. The patient expressed understanding and agreed to proceed.   History and Present Illness:  Back Pain This is a new problem. The current episode started 1 to 4 weeks ago. The problem occurs intermittently. The problem has been waxing and waning since onset. The pain is present in the gluteal. The quality of the pain is described as aching. The pain radiates to the right thigh. The pain is at a severity of 10/10. The pain is moderate. The symptoms are aggravated by standing, twisting and bending. Associated symptoms include leg pain and tingling. Pertinent negatives include no bladder incontinence, bowel incontinence, fever or weakness. She has tried chiropractic manipulation, NSAIDs, bed rest and ice for the symptoms. The treatment provided mild relief.      Review of Systems  Constitutional: Negative for fever.  Gastrointestinal: Negative for bowel  incontinence.  Genitourinary: Negative for bladder incontinence.  Musculoskeletal: Positive for back pain.  Neurological: Positive for tingling. Negative for weakness.  All other systems reviewed and are negative.    Observations/Objective: No SOB or distress noted  Assessment and Plan: 1. Acute right-sided low back pain with right-sided sciatica Rest ROM exercises encouraged No other NSAID's while taking Voltaren Continue Chiropractor Call if symptoms worsen or do not improve   - predniSONE (STERAPRED UNI-PAK 21 TAB) 10 MG (21) TBPK tablet; Use as directed  Dispense: 21 tablet; Refill: 0 - diclofenac (VOLTAREN) 75 MG EC tablet; Take 1 tablet (75 mg total) by mouth 2 (two) times daily.  Dispense: 30 tablet; Refill: 0 - gabapentin (NEURONTIN) 100 MG capsule; Take 1 capsule (100 mg total) by mouth 3 (three) times daily.  Dispense: 90 capsule; Refill: 3     I discussed the assessment and treatment plan with the patient. The patient was provided an opportunity to ask questions and all were answered. The patient agreed with the plan and demonstrated an understanding of the instructions.   The patient was advised to call back or seek an in-person evaluation if the symptoms worsen or if the condition fails to improve as anticipated.  The above assessment and management plan was discussed with the patient. The patient verbalized understanding of and has agreed to the management plan. Patient is aware to call the clinic if symptoms persist or worsen. Patient is aware when to return to the clinic for a follow-up visit. Patient educated on when it is appropriate to go to the emergency department.   Time call ended:  2:27 pm  I provided 10 minutes of non-face-to-face time during  this encounter.    Evelina Dun, FNP

## 2020-01-03 ENCOUNTER — Other Ambulatory Visit: Payer: Self-pay

## 2020-01-04 ENCOUNTER — Ambulatory Visit (INDEPENDENT_AMBULATORY_CARE_PROVIDER_SITE_OTHER): Payer: BC Managed Care – PPO

## 2020-01-04 ENCOUNTER — Encounter: Payer: Self-pay | Admitting: Family Medicine

## 2020-01-04 ENCOUNTER — Ambulatory Visit (INDEPENDENT_AMBULATORY_CARE_PROVIDER_SITE_OTHER): Payer: BC Managed Care – PPO | Admitting: Family Medicine

## 2020-01-04 VITALS — BP 108/58 | HR 69 | Temp 97.8°F | Ht 63.0 in | Wt 173.6 lb

## 2020-01-04 DIAGNOSIS — M5441 Lumbago with sciatica, right side: Secondary | ICD-10-CM

## 2020-01-04 DIAGNOSIS — M545 Low back pain: Secondary | ICD-10-CM | POA: Diagnosis not present

## 2020-01-04 MED ORDER — DICLOFENAC SODIUM 1 % EX GEL: 2.0000 g | Freq: Four times a day (QID) | CUTANEOUS | 1 refills | Status: DC

## 2020-01-04 MED ORDER — KETOROLAC TROMETHAMINE 60 MG/2ML IM SOLN
60.0000 mg | Freq: Once | INTRAMUSCULAR | Status: AC
  Administered 2020-01-04: 60 mg via INTRAMUSCULAR

## 2020-01-04 MED ORDER — METHYLPREDNISOLONE ACETATE 40 MG/ML IJ SUSP
40.0000 mg | Freq: Once | INTRAMUSCULAR | Status: AC
  Administered 2020-01-04: 40 mg via INTRAMUSCULAR

## 2020-01-04 NOTE — Patient Instructions (Signed)
Sciatica  Sciatica is pain, weakness, tingling, or loss of feeling (numbness) along the sciatic nerve. The sciatic nerve starts in the lower back and goes down the back of each leg. Sciatica usually goes away on its own or with treatment. Sometimes, sciatica may come back (recur). What are the causes? This condition happens when the sciatic nerve is pinched or has pressure put on it. This may be the result of:  A disk in between the bones of the spine bulging out too far (herniated disk).  Changes in the spinal disks that occur with aging.  A condition that affects a muscle in the butt.  Extra bone growth near the sciatic nerve.  A break (fracture) of the area between your hip bones (pelvis).  Pregnancy.  Tumor. This is rare. What increases the risk? You are more likely to develop this condition if you:  Play sports that put pressure or stress on the spine.  Have poor strength and ease of movement (flexibility).  Have had a back injury in the past.  Have had back surgery.  Sit for long periods of time.  Do activities that involve bending or lifting over and over again.  Are very overweight (obese). What are the signs or symptoms? Symptoms can vary from mild to very bad. They may include:  Any of these problems in the lower back, leg, hip, or butt: ? Mild tingling, loss of feeling, or dull aches. ? Burning sensations. ? Sharp pains.  Loss of feeling in the back of the calf or the sole of the foot.  Leg weakness.  Very bad back pain that makes it hard to move. These symptoms may get worse when you cough, sneeze, or laugh. They may also get worse when you sit or stand for long periods of time. How is this treated? This condition often gets better without any treatment. However, treatment may include:  Changing or cutting back on physical activity when you have pain.  Doing exercises and stretching.  Putting ice or heat on the affected area.  Medicines that  help: ? To relieve pain and swelling. ? To relax your muscles.  Shots (injections) of medicines that help to relieve pain, irritation, and swelling.  Surgery. Follow these instructions at home: Medicines  Take over-the-counter and prescription medicines only as told by your doctor.  Ask your doctor if the medicine prescribed to you: ? Requires you to avoid driving or using heavy machinery. ? Can cause trouble pooping (constipation). You may need to take these steps to prevent or treat trouble pooping:  Drink enough fluids to keep your pee (urine) pale yellow.  Take over-the-counter or prescription medicines.  Eat foods that are high in fiber. These include beans, whole grains, and fresh fruits and vegetables.  Limit foods that are high in fat and sugar. These include fried or sweet foods. Managing pain      If told, put ice on the affected area. ? Put ice in a plastic bag. ? Place a towel between your skin and the bag. ? Leave the ice on for 20 minutes, 2-3 times a day.  If told, put heat on the affected area. Use the heat source that your doctor tells you to use, such as a moist heat pack or a heating pad. ? Place a towel between your skin and the heat source. ? Leave the heat on for 20-30 minutes. ? Remove the heat if your skin turns bright red. This is very important if you are   unable to feel pain, heat, or cold. You may have a greater risk of getting burned. Activity   Return to your normal activities as told by your doctor. Ask your doctor what activities are safe for you.  Avoid activities that make your symptoms worse.  Take short rests during the day. ? When you rest for a long time, do some physical activity or stretching between periods of rest. ? Avoid sitting for a long time without moving. Get up and move around at least one time each hour.  Exercise and stretch regularly, as told by your doctor.  Do not lift anything that is heavier than 10 lb (4.5 kg)  while you have symptoms of sciatica. ? Avoid lifting heavy things even when you do not have symptoms. ? Avoid lifting heavy things over and over.  When you lift objects, always lift in a way that is safe for your body. To do this, you should: ? Bend your knees. ? Keep the object close to your body. ? Avoid twisting. General instructions  Stay at a healthy weight.  Wear comfortable shoes that support your feet. Avoid wearing high heels.  Avoid sleeping on a mattress that is too soft or too hard. You might have less pain if you sleep on a mattress that is firm enough to support your back.  Keep all follow-up visits as told by your doctor. This is important. Contact a doctor if:  You have pain that: ? Wakes you up when you are sleeping. ? Gets worse when you lie down. ? Is worse than the pain you have had in the past. ? Lasts longer than 4 weeks.  You lose weight without trying. Get help right away if:  You cannot control when you pee (urinate) or poop (have a bowel movement).  You have weakness in any of these areas and it gets worse: ? Lower back. ? The area between your hip bones. ? Butt. ? Legs.  You have redness or swelling of your back.  You have a burning feeling when you pee. Summary  Sciatica is pain, weakness, tingling, or loss of feeling (numbness) along the sciatic nerve.  This condition happens when the sciatic nerve is pinched or has pressure put on it.  Sciatica can cause pain, tingling, or loss of feeling (numbness) in the lower back, legs, hips, and butt.  Treatment often includes rest, exercise, medicines, and putting ice or heat on the affected area. This information is not intended to replace advice given to you by your health care provider. Make sure you discuss any questions you have with your health care provider. Document Revised: 01/01/2019 Document Reviewed: 01/01/2019 Elsevier Patient Education  2020 [employer]  

## 2020-01-04 NOTE — Progress Notes (Signed)
Subjective:  Patient ID: Tabitha Trujillo, female    DOB: December 01, 1986, 34 y.o.   MRN: [id number]  Patient Care Team: Raliegh Ip, DO as PCP - General (Family Medicine)   Chief Complaint:  Leg Pain (She thinks its a pinched nerve)   HPI: Tabitha Trujillo is a 34 y.o. female presenting on 01/04/2020 for Leg Pain (She thinks its a pinched nerve)   Pt presents with ongoing right leg pain that starts in her right hip and radiates down her leg. This started several weeks ago and is getting worse. She has been seen by chiropractor and at this office for this pain. She was placed on steroids, gabapentin, and voltaren on 12/17/2019. States the voltaren and gabapentin made her feel bad so she stopped taking them. States the steroids did help for a bit but the pain returned. Pt states she has a hard time walking due to the pain in her leg. States weight bearing makes it worse. No loss of function, saddle anesthesia, bowel or bladder incontinence, or weakness. No recent injuries. No imaging to date.   Leg Pain  The incident occurred more than 1 week ago. There was no injury mechanism. The pain is present in the right hip, right thigh and right leg. The quality of the pain is described as aching, burning and shooting. The pain is at a severity of 7/10. The pain is moderate. The pain has been fluctuating since onset. Associated symptoms include tingling. Pertinent negatives include no inability to bear weight, loss of motion, loss of sensation, muscle weakness or numbness. The symptoms are aggravated by movement and weight bearing. She has tried NSAIDs (steroids, gabapentin) for the symptoms. The treatment provided no relief.    Relevant past medical, surgical, family, and social history reviewed and updated as indicated.  Allergies and medications reviewed and updated. Date reviewed: Chart in Epic.   Past Medical History:  Diagnosis Date  . Ovarian cyst    right side    Past  Surgical History:  Procedure Laterality Date  . OVARIAN CYST REMOVAL Right    at 34 yo    Social History   Socioeconomic History  . Marital status: Married    Spouse name: Not on file  . Number of children: Not on file  . Years of education: Not on file  . Highest education level: Not on file  Occupational History  . Not on file  Tobacco Use  . Smoking status: Former Smoker    Packs/day: 0.50    Years: 5.00    Pack years: 2.50    Types: Cigarettes    Quit date: 2013    Years since quitting: 8.0  . Smokeless tobacco: Never Used  Substance and Sexual Activity  . Alcohol use: Yes    Comment: occassionally  . Drug use: No  . Sexual activity: Yes    Birth control/protection: Condom  Other Topics Concern  . Not on file  Social History Narrative  . Not on file   Social Determinants of Health   Financial Resource Strain:   . Difficulty of Paying Living Expenses: Not on file  Food Insecurity:   . Worried About Programme researcher, broadcasting/film/video in the Last Year: Not on file  . Ran Out of Food in the Last Year: Not on file  Transportation Needs:   . Lack of Transportation (Medical): Not on file  . Lack of Transportation (Non-Medical): Not on file  Physical Activity:   . Days of Exercise  per Week: Not on file  . Minutes of Exercise per Session: Not on file  Stress:   . Feeling of Stress : Not on file  Social Connections:   . Frequency of Communication with Friends and Family: Not on file  . Frequency of Social Gatherings with Friends and Family: Not on file  . Attends Religious Services: Not on file  . Active Member of Clubs or Organizations: Not on file  . Attends Archivist Meetings: Not on file  . Marital Status: Not on file  Intimate Partner Violence:   . Fear of Current or Ex-Partner: Not on file  . Emotionally Abused: Not on file  . Physically Abused: Not on file  . Sexually Abused: Not on file    Outpatient Encounter Medications as of 01/04/2020  Medication Sig   . diclofenac Sodium (VOLTAREN) 1 % GEL Apply 2 g topically 4 (four) times daily.  . [DISCONTINUED] diclofenac (VOLTAREN) 75 MG EC tablet Take 1 tablet (75 mg total) by mouth 2 (two) times daily. (Patient not taking: Reported on 01/04/2020)  . [DISCONTINUED] gabapentin (NEURONTIN) 100 MG capsule Take 1 capsule (100 mg total) by mouth 3 (three) times daily. (Patient not taking: Reported on 01/04/2020)  . [DISCONTINUED] meclizine (ANTIVERT) 12.5 MG tablet Take 1-2 tablets (12.5-25 mg total) by mouth 3 (three) times daily as needed for dizziness.  . [DISCONTINUED] predniSONE (STERAPRED UNI-PAK 21 TAB) 10 MG (21) TBPK tablet Use as directed  . [EXPIRED] ketorolac (TORADOL) injection 60 mg   . [EXPIRED] methylPREDNISolone acetate (DEPO-MEDROL) injection 40 mg    No facility-administered encounter medications on file as of 01/04/2020.    Allergies  Allergen Reactions  . Azithromycin Nausea And Vomiting  . Demerol [Meperidine] Nausea And Vomiting    Review of Systems  Constitutional: Negative for activity change, appetite change, chills, diaphoresis, fatigue, fever and unexpected weight change.  HENT: Negative.   Eyes: Negative.   Respiratory: Negative for cough, chest tightness and shortness of breath.   Cardiovascular: Negative for chest pain, palpitations and leg swelling.  Gastrointestinal: Negative for abdominal pain, blood in stool, constipation, diarrhea, nausea and vomiting.  Endocrine: Negative.   Genitourinary: Negative for decreased urine volume, difficulty urinating, dysuria, frequency and urgency.  Musculoskeletal: Positive for arthralgias, gait problem and myalgias. Negative for neck pain and neck stiffness.  Skin: Negative.   Allergic/Immunologic: Negative.   Neurological: Positive for tingling. Negative for dizziness, numbness and headaches.  Hematological: Negative.   Psychiatric/Behavioral: Negative for agitation, confusion, hallucinations, sleep disturbance and suicidal ideas.   All other systems reviewed and are negative.       Objective:  BP (!) 108/58   Pulse 69   Temp 97.8 F (36.6 C) (Temporal)   Ht 5\' 3"  (1.6 m)   Wt 173 lb 9.6 oz (78.7 kg)   SpO2 100%   BMI 30.75 kg/m    Wt Readings from Last 3 Encounters:  01/04/20 173 lb 9.6 oz (78.7 kg)  11/14/18 166 lb (75.3 kg)  10/19/17 173 lb (78.5 kg)    Physical Exam Vitals and nursing note reviewed.  Constitutional:      General: She is not in acute distress.    Appearance: Normal appearance. She is well-developed and well-groomed. She is not ill-appearing, toxic-appearing or diaphoretic.  HENT:     Head: Normocephalic and atraumatic.     Jaw: There is normal jaw occlusion.     Right Ear: Hearing normal.     Left Ear: Hearing normal.  Nose: Nose normal.     Mouth/Throat:     Lips: Pink.     Mouth: Mucous membranes are moist.     Pharynx: Oropharynx is clear. Uvula midline.  Eyes:     General: Lids are normal.     Extraocular Movements: Extraocular movements intact.     Conjunctiva/sclera: Conjunctivae normal.     Pupils: Pupils are equal, round, and reactive to light.  Neck:     Thyroid: No thyroid mass, thyromegaly or thyroid tenderness.     Vascular: No carotid bruit or JVD.     Trachea: Trachea and phonation normal.  Cardiovascular:     Rate and Rhythm: Normal rate and regular rhythm.     Chest Wall: PMI is not displaced.     Pulses: Normal pulses.     Heart sounds: Normal heart sounds. No murmur. No friction rub. No gallop.   Pulmonary:     Effort: Pulmonary effort is normal. No respiratory distress.     Breath sounds: Normal breath sounds. No wheezing.  Abdominal:     General: Bowel sounds are normal. There is no distension or abdominal bruit.     Palpations: Abdomen is soft. There is no hepatomegaly or splenomegaly.     Tenderness: There is no abdominal tenderness. There is no right CVA tenderness or left CVA tenderness.     Hernia: No hernia is present.  Musculoskeletal:      Cervical back: Normal range of motion and neck supple.     Lumbar back: Normal.     Right hip: Tenderness present.     Right upper leg: Tenderness present.     Right knee: Normal.     Right lower leg: Normal. No edema.     Left lower leg: No edema.     Comments: Right SI joint pain  Lymphadenopathy:     Cervical: No cervical adenopathy.  Skin:    General: Skin is warm and dry.     Capillary Refill: Capillary refill takes less than 2 seconds.     Coloration: Skin is not cyanotic, jaundiced or pale.     Findings: No rash.  Neurological:     General: No focal deficit present.     Mental Status: She is alert and oriented to person, place, and time.     Cranial Nerves: Cranial nerves are intact. No cranial nerve deficit.     Sensory: Sensation is intact. No sensory deficit.     Motor: Motor function is intact. No weakness.     Coordination: Coordination is intact. Coordination normal.     Gait: Gait abnormal (antalgic, using crutches).     Deep Tendon Reflexes: Reflexes are normal and symmetric. Reflexes normal.  Psychiatric:        Attention and Perception: Attention and perception normal.        Mood and Affect: Mood and affect normal.        Speech: Speech normal.        Behavior: Behavior normal. Behavior is cooperative.        Thought Content: Thought content normal.        Cognition and Memory: Cognition and memory normal.        Judgment: Judgment normal.     Results for orders placed or performed in visit on 10/19/17  Microscopic Examination   URINE  Result Value Ref Range   WBC, UA None seen 0 - 5 /hpf   RBC, UA None seen 0 - 2 /hpf  Epithelial Cells (non renal) 0-10 0 - 10 /hpf   Renal Epithel, UA None seen None seen /hpf   Bacteria, UA None seen None seen/Few  Pregnancy, urine  Result Value Ref Range   Preg Test, Ur Negative Negative  Urinalysis, Complete  Result Value Ref Range   Specific Gravity, UA 1.010 1.005 - 1.030   pH, UA 7.0 5.0 - 7.5   Color,  UA Yellow Yellow   Appearance Ur Clear Clear   Leukocytes, UA Negative Negative   Protein, UA Negative Negative/Trace   Glucose, UA Negative Negative   Ketones, UA Negative Negative   RBC, UA Negative Negative   Bilirubin, UA Negative Negative   Urobilinogen, Ur 0.2 0.2 - 1.0 mg/dL   Nitrite, UA Negative Negative   Microscopic Examination See below:      X-Ray: lumbar spine: scoliosis, no disc space narrowing. No acute findings. Preliminary x-ray reading by Kari Baars, FNP-C, [facility].   Pertinent labs & imaging results that were available during my care of the patient were reviewed by me and considered in my medical decision making.  Assessment & Plan:  Tabitha Trujillo was seen today for leg pain.  Diagnoses and all orders for this visit:  Acute right-sided low back pain with right-sided sciatica Ongoing symptoms. Unable to tolerate oral NSAIDs and gabapentin. Will give IM Toradol and DepoMedrol today. Xray unremarkable. Topical Voltaren gel. Referral to PT. Follow up in 4-6 weeks or sooner if not improving.  -     DG Lumbar Spine 2-3 Views; Future -     Ambulatory referral to Physical Therapy -     diclofenac Sodium (VOLTAREN) 1 % GEL; Apply 2 g topically 4 (four) times daily. -     methylPREDNISolone acetate (DEPO-MEDROL) injection 40 mg -     ketorolac (TORADOL) injection 60 mg     Continue all other maintenance medications.  Follow up plan: Return in about 6 weeks (around 02/15/2020), or if symptoms worsen or fail to improve, for PCP.  Continue healthy lifestyle choices, including diet (rich in fruits, vegetables, and lean proteins, and low in salt and simple carbohydrates) and exercise (at least 30 minutes of moderate physical activity daily).  Educational handout given for sciatica  The above assessment and management plan was discussed with the patient. The patient verbalized understanding of and has agreed to the management plan. Patient is aware to call the clinic if they  develop any new symptoms or if symptoms persist or worsen. Patient is aware when to return to the clinic for a follow-up visit. Patient educated on when it is appropriate to go to the emergency department.   Kari Baars, FNP-C [facility] [phone]

## 2020-01-07 ENCOUNTER — Other Ambulatory Visit: Payer: Self-pay | Admitting: Family Medicine

## 2020-01-07 ENCOUNTER — Telehealth: Payer: Self-pay | Admitting: Family Medicine

## 2020-01-07 ENCOUNTER — Other Ambulatory Visit: Payer: Self-pay | Admitting: *Deleted

## 2020-01-07 DIAGNOSIS — M5441 Lumbago with sciatica, right side: Secondary | ICD-10-CM

## 2020-01-07 NOTE — Telephone Encounter (Signed)
Patient states that she has not had any significant improvement over the weekend.  Patient states that she is still having burning sensation, a lot of pain, trouble standing for long periods of time, trouble sleeping and trouble getting comfortable when sitting.

## 2020-01-07 NOTE — Progress Notes (Signed)
Patient aware.

## 2020-01-07 NOTE — Progress Notes (Signed)
I agree with Michelle's assessment.  Would at minimum pursue orthopedic evaluation/ referral.  If she wants to hold off on MRI until they assess her, I think this is reasonable.

## 2020-01-07 NOTE — Progress Notes (Signed)
Patient would like to go ahead with ortho referral. Patient would like to stay in [facility]

## 2020-01-07 NOTE — Telephone Encounter (Signed)
Can place order for MRI or refer to ortho. Does she have a preference of who she would like to see. Has she been to PT?

## 2020-01-07 NOTE — Progress Notes (Signed)
Will place referral

## 2020-01-09 ENCOUNTER — Ambulatory Visit: Payer: BC Managed Care – PPO | Attending: Family Medicine | Admitting: Physical Therapy

## 2020-01-09 ENCOUNTER — Encounter: Payer: Self-pay | Admitting: Physical Therapy

## 2020-01-09 ENCOUNTER — Other Ambulatory Visit: Payer: Self-pay

## 2020-01-09 DIAGNOSIS — M5441 Lumbago with sciatica, right side: Secondary | ICD-10-CM

## 2020-01-09 DIAGNOSIS — R293 Abnormal posture: Secondary | ICD-10-CM

## 2020-01-09 NOTE — Therapy (Signed)
[facility] [address] [place], [place], [address] Phone: [phone]   Fax:  [phone]  Physical Therapy Evaluation  Patient Details  Name: Tabitha Trujillo MRN: [id number] Date of Birth: June 11, 1986 Referring Provider (PT): Gilford Silvius, FNP-C   Encounter Date: 01/09/2020  PT End of Session - 01/09/20 1051    Visit Number  1    Number of Visits  12    Date for PT Re-Evaluation  02/27/20    Authorization Type  progress note every 10th visit    PT Start Time  0945    PT Stop Time  1028    PT Time Calculation (min)  43 min    Equipment Utilized During Treatment  --   axillary crutch   Activity Tolerance  Patient limited by pain    Behavior During Therapy  [facility] for tasks assessed/performed       Past Medical History:  Diagnosis Date  . Ovarian cyst    right side    Past Surgical History:  Procedure Laterality Date  . OVARIAN CYST REMOVAL Right    at 34 yo    There were no vitals filed for this visit.   Subjective Assessment - 01/09/20 1048    Subjective  COVID-19 screening performed upon arrival.Patient arrives to physical therapy with reports of severe right sided low back pain with sharp, shooting, pain down to right foot that began the last week of November 2020. Patient is unsure of mechanism of injury and reported a progression of pain and symptoms. Patient requires assistance for ADLs and home tasks from her husband due to pain. Patient reports pain increases with excessive walking, stretching, and bending. Patient ambulates with one crutch around the community for added support. Patient reports pain at worst as 10/10 and pain at best as 3/10. Patient's goals are to decrease pain, improve movement, improve ability to perform home and caregiving activities, and return to PLOF and exercise routine.    Pertinent History  unremarkable    Limitations  Sitting;Standing;Walking;House hold activities    How long can you  stand comfortably?  short periods    How long can you walk comfortably?  short periods    Diagnostic tests  x-ray: see imaging    Currently in Pain?  Yes    Pain Score  6     Pain Location  Back    Pain Orientation  Right    Pain Descriptors / Indicators  Shooting;Sharp;Throbbing;Tingling    Pain Type  Acute pain    Pain Radiating Towards  right foot    Pain Onset  More than a month ago    Pain Frequency  Constant    Aggravating Factors   excessive walking, stretching some bending    Pain Relieving Factors  sitting with pillow under leg    Effect of Pain on Daily Activities  driving standing, sleeping.         [facility] PT Assessment - 01/09/20 0001      Assessment   Medical Diagnosis  acute right-sided low back pain with right sided sciatica    Referring Provider (PT)  Gilford Silvius, FNP-C    Onset Date/Surgical Date  --   end of November 2020   Next MD Visit  n/a    Prior Therapy  no      Precautions   Precautions  None      Restrictions   Weight Bearing Restrictions  No      Balance Screen  Has the patient fallen in the past 6 months  No    Has the patient had a decrease in activity level because of a fear of falling?   No    Is the patient reluctant to leave their home because of a fear of falling?   No      Home Environment   Living Environment  Private residence    Living Arrangements  Spouse/significant other;Children      Prior Function   Level of Independence  Needs assistance with ADLs;Needs assistance with homemaking;Independent with household mobility with device      Posture/Postural Control   Posture/Postural Control  Postural limitations    Postural Limitations  Rounded Shoulders;Forward head;Flexed trunk;Decreased lumbar lordosis      ROM / Strength   AROM / PROM / Strength  AROM      AROM   Overall AROM   Due to pain    AROM Assessment Site  Lumbar    Lumbar Flexion  20" finger tip to floor    Lumbar Extension  5 degrees from neutral    Lumbar -  Right Side Bend  17.5" finger tip to floor    Lumbar - Left Side Bend  17" finger tip to floor   (+) throbbing, burning pain     Palpation   Palpation comment  very tender to palpation to R QL, upper gluteals and in piriformis. increased tone in QL and R glute. tenderness throughout hamstring      Special Tests    Special Tests  Lumbar    Lumbar Tests  Slump Test;Straight Leg Raise      Slump test   Findings  Positive    Side  Right      Straight Leg Raise   Findings  Positive    Side   Right      Transfers   Comments  very slow painful transitional movements from sitting to standing and for bed mobility       Ambulation/Gait   Assistive device  R Axillary Crutch    Gait Pattern  Step-to pattern;Step-through pattern;Decreased step length - left;Decreased stance time - right;Decreased stride length;Decreased weight shift to right;Right foot flat;Antalgic;Lateral trunk lean to left;Trunk flexed                Objective measurements completed on examination: See above findings.              PT Education - 01/09/20 1050    Education Details  Draw ins, glute sets, trunk extension    Person(s) Educated  Patient    Methods  Explanation;Demonstration;Handout    Comprehension  Verbalized understanding;Returned demonstration          PT Long Term Goals - 01/09/20 1052      PT LONG TERM GOAL #1   Title  Patient will be independent with HEP    Time  6    Period  Weeks    Status  New      PT LONG TERM GOAL #2   Title  Patient will report a centralization of right sided neurological symptoms to low back or report no neurological symptoms to reduce nerve irritation.    Time  6    Period  Weeks    Status  New      PT LONG TERM GOAL #3   Title  Patient will report ability to perform ADLs and home activities with low back pain less than or equal to 4/10  Time  6    Period  Weeks    Status  New      PT LONG TERM GOAL #4   Title  Patient will return to  exercise program with no right sided neurological symptoms and low back pain less than or equal to 4/10.    Time  6    Period  Weeks    Status  New             Plan - 01/09/20 1250    Clinical Impression Statement  Patient is a 34 year old female who presents to physical therapy with low back pain, neurological symptoms to right foot, decreased ROM, and decreased bilateral MMT when grossly assessed. Patient ambulates with a very cautious and antalgic gait pattern with use of R axillary crutch. Patient (+) for SLR test as well as (+) right Slump test indicating nerve involvement. Patient provided with HEP as was educated on L axillary crutch use during gait. Patient would benefit from skilled physical therapy to address deficits and address patient's goals.    Personal Factors and Comorbidities  Age;Fitness;Time since onset of injury/illness/exacerbation    Examination-Activity Limitations  Bed Mobility;Transfers;Stand;Locomotion Level    Examination-Participation Restrictions  Cleaning;Meal Prep;Driving;Laundry    Stability/Clinical Decision Making  Evolving/Moderate complexity    Clinical Decision Making  Low    Rehab Potential  Good    PT Frequency  2x / week    PT Duration  6 weeks    PT Treatment/Interventions  ADLs/Self Care Home Management;Gait training;Stair training;Functional mobility training;Therapeutic activities;Therapeutic exercise;Balance training;Neuromuscular re-education;Manual techniques;Moist Heat;Cryotherapy;Chemical engineer;Patient/family education;Passive range of motion;Dry needling    PT Next Visit Plan  gentle core, STW/M to QL and glutes, when tolerable extension based TEs and traction. Modalities PRN for pain relief.    Consulted and Agree with Plan of Care  Patient       Patient will benefit from skilled therapeutic intervention in order to improve the following deficits and impairments:  Abnormal gait,  Difficulty walking, Decreased activity tolerance, Decreased strength, Decreased range of motion, Pain, Postural dysfunction  Visit Diagnosis: Acute right-sided low back pain with right-sided sciatica  Abnormal posture     Problem List Patient Active Problem List   Diagnosis Date Noted  . History of ovarian cystectomy 10/19/2017  . BMI 30.0-30.9,adult 10/19/2017    Guss Bunde, PT, DPT 01/09/2020, 12:57 PM  [facility] [address] [place], [place], [address] Phone: [phone]   Fax:  [phone]  Name: Tabitha Trujillo MRN: [id number] Date of Birth: 09/14/1986

## 2020-01-10 ENCOUNTER — Ambulatory Visit: Payer: BC Managed Care – PPO | Admitting: Orthopaedic Surgery

## 2020-01-11 ENCOUNTER — Other Ambulatory Visit: Payer: Self-pay

## 2020-01-11 ENCOUNTER — Ambulatory Visit: Payer: BC Managed Care – PPO | Admitting: Physical Therapy

## 2020-01-11 DIAGNOSIS — M5441 Lumbago with sciatica, right side: Secondary | ICD-10-CM | POA: Diagnosis not present

## 2020-01-11 DIAGNOSIS — R293 Abnormal posture: Secondary | ICD-10-CM

## 2020-01-11 NOTE — Therapy (Signed)
[facility] [address] [place], [place], [address] Phone: [phone]   Fax:  [phone]  Physical Therapy Treatment  Patient Details  Name: Marinda Tyer MRN: [id number] Date of Birth: [birth year] Referring Provider (PT): Gilford Silvius, FNP-C   Encounter Date: 01/11/2020  PT End of Session - 01/11/20 1052    Visit Number  2    Number of Visits  12    Date for PT Re-Evaluation  02/27/20    Authorization Type  progress note every 10th visit    PT Start Time  0945    PT Stop Time  1033    PT Time Calculation (min)  48 min    Activity Tolerance  Patient limited by pain    Behavior During Therapy  [facility] for tasks assessed/performed       Past Medical History:  Diagnosis Date   Ovarian cyst    right side    Past Surgical History:  Procedure Laterality Date   OVARIAN CYST REMOVAL Right    at 34 yo    There were no vitals filed for this visit.  Subjective Assessment - 01/11/20 1303    Subjective  COVID-19 screening performed upon arrival. Patient reports about 5/10. States pain is worst in the morning.    Pertinent History  unremarkable    Limitations  Sitting;Standing;Walking;House hold activities    How long can you stand comfortably?  short periods    How long can you walk comfortably?  short periods    Diagnostic tests  x-ray: see imaging    Currently in Pain?  Yes    Pain Score  5     Pain Location  Back    Pain Orientation  Right    Pain Descriptors / Indicators  Shooting;Sharp;Throbbing;Tingling    Pain Type  Acute pain    Pain Onset  More than a month ago    Pain Frequency  Constant         [facility] PT Assessment - 01/11/20 0001      Assessment   Medical Diagnosis  acute right-sided low back pain with right sided sciatica    Referring Provider (PT)  Gilford Silvius, FNP-C    Next MD Visit  n/a    Prior Therapy  no                   [facility] Adult PT Treatment/Exercise - 01/11/20 0001       Exercises   Exercises  Lumbar      Lumbar Exercises: Aerobic   Nustep  Level 1 x10      Lumbar Exercises: Supine   Other Supine Lumbar Exercises  nerve glides 2x10 with pillow under LE      Modalities   Modalities  Electrical Stimulation      Electrical Stimulation   Electrical Stimulation Location  Right glute    Electrical Stimulation Action  IFC    Electrical Stimulation Parameters  80-150 hz x15 mins    Electrical Stimulation Goals  Pain      Manual Therapy   Manual Therapy  Soft tissue mobilization    Soft tissue mobilization  STW/M to right glute to reduce tone and pain in left sidelying.                  PT Long Term Goals - 01/09/20 1052      PT LONG TERM GOAL #1   Title  Patient will be independent with HEP    Time  6    Period  Weeks    Status  New      PT LONG TERM GOAL #2   Title  Patient will report a centralization of right sided neurological symptoms to low back or report no neurological symptoms to reduce nerve irritation.    Time  6    Period  Weeks    Status  New      PT LONG TERM GOAL #3   Title  Patient will report ability to perform ADLs and home activities with low back pain less than or equal to 4/10    Time  6    Period  Weeks    Status  New      PT LONG TERM GOAL #4   Title  Patient will return to exercise program with no right sided neurological symptoms and low back pain less than or equal to 4/10.    Time  6    Period  Weeks    Status  New            Plan - 01/11/20 1258    Clinical Impression Statement  Patient tolerated treatment fairly. Patient reported an increase of neurological symptoms when right LE was elevated for nerve glides. Patient was able to tolerate with pillow under the leg. Patient noted with increased tone to right glute with increase of discomfort when STW/M around SIJ region. Patient discussed use of TENs unit for pain relief. No adverse affects upon the removal of modalities.    Personal Factors  and Comorbidities  Age;Fitness;Time since onset of injury/illness/exacerbation    Examination-Activity Limitations  Bed Mobility;Transfers;Stand;Locomotion Level    Examination-Participation Restrictions  Cleaning;Meal Prep;Driving;Laundry    Stability/Clinical Decision Making  Evolving/Moderate complexity    Clinical Decision Making  Low    Rehab Potential  Good    PT Frequency  2x / week    PT Duration  6 weeks    PT Treatment/Interventions  ADLs/Self Care Home Management;Gait training;Stair training;Functional mobility training;Therapeutic activities;Therapeutic exercise;Balance training;Neuromuscular re-education;Manual techniques;Moist Heat;Cryotherapy;Herbalist;Patient/family education;Passive range of motion;Dry needling    PT Next Visit Plan  gentle core, STW/M to QL and glutes, when tolerable extension based TEs and traction. Modalities PRN for pain relief.    Consulted and Agree with Plan of Care  Patient       Patient will benefit from skilled therapeutic intervention in order to improve the following deficits and impairments:  Abnormal gait, Difficulty walking, Decreased activity tolerance, Decreased strength, Decreased range of motion, Pain, Postural dysfunction  Visit Diagnosis: Acute right-sided low back pain with right-sided sciatica  Abnormal posture     Problem List Patient Active Problem List   Diagnosis Date Noted   History of ovarian cystectomy 10/19/2017   BMI 30.0-30.9,adult 10/19/2017    Gabriela Eves, PT, DPT 01/11/2020, 1:12 PM  [facility] [place], [place], [address] Phone: [phone]   Fax:  [phone]  Name: Fey Coghill MRN: [id number] Date of Birth: 1985/12/31

## 2020-01-17 ENCOUNTER — Ambulatory Visit: Payer: BC Managed Care – PPO | Admitting: *Deleted

## 2020-01-17 ENCOUNTER — Other Ambulatory Visit: Payer: Self-pay

## 2020-01-17 DIAGNOSIS — R293 Abnormal posture: Secondary | ICD-10-CM

## 2020-01-17 DIAGNOSIS — M5441 Lumbago with sciatica, right side: Secondary | ICD-10-CM | POA: Diagnosis not present

## 2020-01-17 NOTE — Therapy (Signed)
[facility] [address] [place], [place], [address] Phone: [phone]   Fax:  [phone]  Physical Therapy Treatment  Patient Details  Name: Tabitha Trujillo MRN: [id number] Date of Birth: [birth year] Referring Provider (PT): Gilford Silvius, FNP-C   Encounter Date: 01/17/2020  PT End of Session - 01/17/20 1756    Visit Number  3    Number of Visits  12    Date for PT Re-Evaluation  02/27/20    Authorization Type  progress note every 10th visit    PT Start Time  1115    PT Stop Time  1206    PT Time Calculation (min)  51 min       Past Medical History:  Diagnosis Date  . Ovarian cyst    right side    Past Surgical History:  Procedure Laterality Date  . OVARIAN CYST REMOVAL Right    at 34 yo    There were no vitals filed for this visit.  Subjective Assessment - 01/17/20 1124    Subjective  COVID-19 screening performed upon arrival. Patient reports about 5-6/10. States pain is worst in the morning.    Pertinent History  unremarkable    Limitations  Sitting;Standing;Walking;House hold activities    How long can you stand comfortably?  short periods    How long can you walk comfortably?  short periods    Diagnostic tests  x-ray: see imaging    Currently in Pain?  Yes    Pain Score  6     Pain Location  Back    Pain Orientation  Right    Pain Descriptors / Indicators  Shooting;Sharp    Pain Onset  More than a month ago                       [facility] Adult PT Treatment/Exercise - 01/17/20 0001      Exercises   Exercises  Lumbar      Lumbar Exercises: Aerobic   Nustep  Level 1 x10      Lumbar Exercises: Prone   Other Prone Lumbar Exercises  prone over pillows with no decrease in L      Modalities   Modalities  Electrical Stimulation;Traction      Traction   Type of Traction  Lumbar    Min (lbs)  5    Max (lbs)  60    Hold Time  99    Rest Time  5    Time  10      Manual Therapy   Manual  Therapy  Soft tissue mobilization    Soft tissue mobilization  STW/M to right LB paras and SIJ ( very tender) to reduce tone and pain in prone over 2 pillows                  PT Long Term Goals - 01/09/20 1052      PT LONG TERM GOAL #1   Title  Patient will be independent with HEP    Time  6    Period  Weeks    Status  New      PT LONG TERM GOAL #2   Title  Patient will report a centralization of right sided neurological symptoms to low back or report no neurological symptoms to reduce nerve irritation.    Time  6    Period  Weeks    Status  New      PT LONG TERM GOAL #  3   Title  Patient will report ability to perform ADLs and home activities with low back pain less than or equal to 4/10    Time  6    Period  Weeks    Status  New      PT LONG TERM GOAL #4   Title  Patient will return to exercise program with no right sided neurological symptoms and low back pain less than or equal to 4/10.    Time  6    Period  Weeks    Status  New            Plan - 01/17/20 1125    Clinical Impression Statement  Pt arrived today doing fair. Her RT LE symtoms are about the same and LB very tight and tender. She was able to perform low level therex f/b STW to LB  RT side paras prone over two pillows. No change in RT LE symptoms with prone position, but slightly increased during STW to RT SIJ/buttocks area. Traction at 60#s was performed and tolerated well. No change in symptoms.    Examination-Activity Limitations  Bed Mobility;Transfers;Stand;Locomotion Level    Examination-Participation Restrictions  Cleaning;Meal Prep;Driving;Laundry    Stability/Clinical Decision Making  Evolving/Moderate complexity    Rehab Potential  Good    PT Frequency  2x / week    PT Duration  6 weeks    PT Treatment/Interventions  ADLs/Self Care Home Management;Gait training;Stair training;Functional mobility training;Therapeutic activities;Therapeutic exercise;Balance training;Neuromuscular  re-education;Manual techniques;Moist Heat;Cryotherapy;Herbalist;Patient/family education;Passive range of motion;Dry needling    PT Next Visit Plan  Assess Traction             gentle core, STW/M to QL and glutes, when tolerable extension based TEs and traction. Modalities PRN for pain relief.    Consulted and Agree with Plan of Care  Patient       Patient will benefit from skilled therapeutic intervention in order to improve the following deficits and impairments:  Abnormal gait, Difficulty walking, Decreased activity tolerance, Decreased strength, Decreased range of motion, Pain, Postural dysfunction  Visit Diagnosis: Acute right-sided low back pain with right-sided sciatica  Abnormal posture     Problem List Patient Active Problem List   Diagnosis Date Noted  . History of ovarian cystectomy 10/19/2017  . BMI 30.0-30.9,adult 10/19/2017    Tabitha Trujillo,CHRIS, PTA 01/17/2020, 6:02 PM  [facility] [place], [place], [address] Phone: [phone]   Fax:  [phone]  Name: Tabitha Trujillo MRN: [id number] Date of Birth: 08-Aug-1986

## 2020-01-18 ENCOUNTER — Encounter: Payer: Self-pay | Admitting: Family

## 2020-01-18 ENCOUNTER — Ambulatory Visit (INDEPENDENT_AMBULATORY_CARE_PROVIDER_SITE_OTHER): Payer: BC Managed Care – PPO | Admitting: Family

## 2020-01-18 DIAGNOSIS — M5441 Lumbago with sciatica, right side: Secondary | ICD-10-CM

## 2020-01-18 MED ORDER — BACLOFEN 10 MG PO TABS: 10.0000 mg | ORAL_TABLET | Freq: Three times a day (TID) | ORAL | 1 refills | Status: DC

## 2020-01-18 NOTE — Progress Notes (Signed)
Virtual Visit via telephone Note Due to COVID-19 pandemic this visit was conducted virtually. This visit type was conducted due to national recommendations for restrictions regarding the COVID-19 Pandemic (e.g. social distancing, sheltering in place) in an effort to limit this patient's exposure and mitigate transmission in our community. All issues noted in this document were discussed and addressed.  A physical exam was not performed with this format.  I connected with Tabitha Trujillo on 01/18/20 at 10:12 AM  by telephone and verified that I am speaking with the correct person using two identifiers. Tabitha Trujillo is currently located at home and no one is currently with her during visit. The provider, Evelina Dun, FNP is located in their office at time of visit.  I discussed the limitations, risks, security and privacy concerns of performing an evaluation and management service by telephone and the availability of in person appointments. I also discussed with the patient that there may be a patient responsible charge related to this service. The patient expressed understanding and agreed to proceed.   History and Present Illness:  Pt calls the office today with recurrent back pain that started in December. She reports she has tried  NSAID's, prednisone, PT, and gabapentin with no relief. She has also been to the chiropractor.  Back Pain This is a recurrent problem. The current episode started more than 1 month ago. The problem occurs constantly. The problem is unchanged. The pain is present in the lumbar spine. The quality of the pain is described as shooting. The pain radiates to the right foot and right knee. The pain is at a severity of 5/10. The pain is moderate. The symptoms are aggravated by sitting and standing. Associated symptoms include leg pain, tingling and weakness. Pertinent negatives include no bladder incontinence, bowel incontinence, dysuria or weight loss. She has  tried ice, NSAIDs, walking, home exercises, chiropractic manipulation and bed rest for the symptoms. The treatment provided mild relief.      Review of Systems  Constitutional: Negative for weight loss.  Gastrointestinal: Negative for bowel incontinence.  Genitourinary: Negative for bladder incontinence and dysuria.  Musculoskeletal: Positive for back pain.  Neurological: Positive for tingling and weakness.  All other systems reviewed and are negative.    Observations/Objective: No SOB or distress noted   Assessment and Plan: Tabitha Trujillo comes in today with chief complaint of No chief complaint on file.   Diagnosis and orders addressed:  1. Acute right-sided low back pain with right-sided sciatica Continue motrin Continue PT and chiropractor visits Will order MRI given length of pain and no improvement  Baclofen started today, sedation precautions dicussed  May need referral to Ortho- wants to wait on MRI - MR Lumbar Spine Wo Contrast; Future - baclofen (LIORESAL) 10 MG tablet; Take 1 tablet (10 mg total) by mouth 3 (three) times daily.  Dispense: 90 each; Refill: 1      I discussed the assessment and treatment plan with the patient. The patient was provided an opportunity to ask questions and all were answered. The patient agreed with the plan and demonstrated an understanding of the instructions.   The patient was advised to call back or seek an in-person evaluation if the symptoms worsen or if the condition fails to improve as anticipated.  The above assessment and management plan was discussed with the patient. The patient verbalized understanding of and has agreed to the management plan. Patient is aware to call the clinic if symptoms persist or worsen. Patient is aware  when to return to the clinic for a follow-up visit. Patient educated on when it is appropriate to go to the emergency department.   Time call ended:  10:29 AM   I provided 17 minutes of  non-face-to-face time during this encounter.    Tabitha Rodney, FNP

## 2020-01-24 ENCOUNTER — Other Ambulatory Visit: Payer: Self-pay

## 2020-01-24 ENCOUNTER — Ambulatory Visit: Payer: BC Managed Care – PPO | Admitting: *Deleted

## 2020-01-24 DIAGNOSIS — R293 Abnormal posture: Secondary | ICD-10-CM

## 2020-01-24 DIAGNOSIS — M5441 Lumbago with sciatica, right side: Secondary | ICD-10-CM

## 2020-01-24 NOTE — Therapy (Signed)
[facility] [address] [place], [place], [address] Phone: [phone]   Fax:  [phone]  Physical Therapy Treatment  Patient Details  Name: Tabitha Trujillo MRN: [id number] Date of Birth: 1986-04-08 Referring Provider (PT): Gilford Silvius, FNP-C   Encounter Date: 01/24/2020  PT End of Session - 01/24/20 0954    Visit Number  4    Number of Visits  12    Date for PT Re-Evaluation  02/27/20    Authorization Type  progress note every 10th visit    PT Start Time  0945    PT Stop Time  1032    PT Time Calculation (min)  47 min       Past Medical History:  Diagnosis Date  . Ovarian cyst    right side    Past Surgical History:  Procedure Laterality Date  . OVARIAN CYST REMOVAL Right    at 34 yo    There were no vitals filed for this visit.  Subjective Assessment - 01/24/20 0952    Subjective  COVID-19 screening performed upon arrival. Patient reports about 5-6/10. Taking a muscle relaxer now and it is helping    Pertinent History  unremarkable    Limitations  Sitting;Standing;Walking;House hold activities    How long can you stand comfortably?  short periods    How long can you walk comfortably?  short periods    Diagnostic tests  x-ray: see imaging    Currently in Pain?  Yes    Pain Score  4     Pain Location  Back    Pain Orientation  Right    Pain Onset  More than a month ago                       [facility] Adult PT Treatment/Exercise - 01/24/20 0001      Exercises   Exercises  Lumbar      Lumbar Exercises: Aerobic   Nustep  Level 1 x10 seat 8      Lumbar Exercises: Seated   Other Seated Lumbar Exercises  Adductor squeeze x 10 hold 10 secs      Lumbar Exercises: Supine   Bridge with Ball Squeeze  10 reps    Other Supine Lumbar Exercises  adductor squeeze x 10 hold  10 secs      Lumbar Exercises: Prone   Other Prone Lumbar Exercises  Prone x 5 mins, RT LE symptoms about the same with some  increased calf tightness      Modalities   Modalities  Ultrasound      Ultrasound   Ultrasound Location  RT LB paras    Ultrasound Parameters  1.5 w/cm2 x 8 mins in prone position    Ultrasound Goals  Pain      Manual Therapy   Manual Therapy  Soft tissue mobilization    Soft tissue mobilization  STW/M to right LB paras and SIJ ( very tender at glute) to reduce tone /pain in prone                   PT Long Term Goals - 01/09/20 1052      PT LONG TERM GOAL #1   Title  Patient will be independent with HEP    Time  6    Period  Weeks    Status  New      PT LONG TERM GOAL #2   Title  Patient will report a centralization of  right sided neurological symptoms to low back or report no neurological symptoms to reduce nerve irritation.    Time  6    Period  Weeks    Status  New      PT LONG TERM GOAL #3   Title  Patient will report ability to perform ADLs and home activities with low back pain less than or equal to 4/10    Time  6    Period  Weeks    Status  New      PT LONG TERM GOAL #4   Title  Patient will return to exercise program with no right sided neurological symptoms and low back pain less than or equal to 4/10.    Time  6    Period  Weeks    Status  New            Plan - 01/24/20 1048    Clinical Impression Statement  Pt arrives today feeling some better after taking mm relaxant and decreased tightness in LB. She reports not wanting to try traction again due to not feeling like it helped. Pt was guided through some sitting and supine stabilization exs f/b [place] and STW to RT LB paras and SIJ area and tolkerated well.    Personal Factors and Comorbidities  Age;Fitness;Time since onset of injury/illness/exacerbation    Examination-Activity Limitations  Bed Mobility;Transfers;Stand;Locomotion Level    Examination-Participation Restrictions  Cleaning;Meal Prep;Driving;Laundry    Stability/Clinical Decision Making  Evolving/Moderate complexity    Rehab  Potential  Good    PT Frequency  2x / week    PT Duration  6 weeks    PT Treatment/Interventions  ADLs/Self Care Home Management;Gait training;Stair training;Functional mobility training;Therapeutic activities;Therapeutic exercise;Balance training;Neuromuscular re-education;Manual techniques;Moist Heat;Cryotherapy;Herbalist;Patient/family education;Passive range of motion;Dry needling    PT Next Visit Plan  gentle core, STW/M to QL and glutes, when tolerable extension based TEs . Modalities PRN for pain relief.    Consulted and Agree with Plan of Care  Patient       Patient will benefit from skilled therapeutic intervention in order to improve the following deficits and impairments:  Abnormal gait, Difficulty walking, Decreased activity tolerance, Decreased strength, Decreased range of motion, Pain, Postural dysfunction  Visit Diagnosis: Acute right-sided low back pain with right-sided sciatica  Abnormal posture     Problem List Patient Active Problem List   Diagnosis Date Noted  . History of ovarian cystectomy 10/19/2017  . BMI 30.0-30.9,adult 10/19/2017    [name],CHRIS, PTA 01/24/2020, 10:53 AM  [facility] [place], [place], [address] Phone: [phone]   Fax:  [phone]  Name: Tabitha Trujillo MRN: [id number] Date of Birth: 10/14/1986

## 2020-01-29 ENCOUNTER — Other Ambulatory Visit: Payer: Self-pay | Admitting: Family

## 2020-01-29 DIAGNOSIS — M545 Low back pain, unspecified: Secondary | ICD-10-CM

## 2020-01-29 NOTE — Progress Notes (Signed)
Referral Ortho placed. MRI denied. Will have to follow up with them to order MRI.

## 2020-01-29 NOTE — Progress Notes (Signed)
Patient aware and verbalized understanding. °

## 2020-01-31 ENCOUNTER — Ambulatory Visit: Payer: BC Managed Care – PPO | Attending: Family Medicine | Admitting: *Deleted

## 2020-01-31 ENCOUNTER — Other Ambulatory Visit: Payer: Self-pay

## 2020-01-31 ENCOUNTER — Other Ambulatory Visit: Payer: Self-pay | Admitting: Family

## 2020-01-31 DIAGNOSIS — R293 Abnormal posture: Secondary | ICD-10-CM | POA: Diagnosis present

## 2020-01-31 DIAGNOSIS — M5441 Lumbago with sciatica, right side: Secondary | ICD-10-CM | POA: Insufficient documentation

## 2020-01-31 NOTE — Therapy (Signed)
[facility] [place], [place], [address] Phone: [phone]   Fax:  [phone]  Physical Therapy Treatment  Patient Details  Name: Tabitha Trujillo MRN: [id number] Date of Birth: June 25, 1986 Referring Provider (PT): Darla Lesches, FNP-C   Encounter Date: 01/31/2020  PT End of Session - 01/31/20 1043    Visit Number  5    Number of Visits  12    Date for PT Re-Evaluation  02/27/20    Authorization Type  progress note every 10th visit    PT Start Time  1035    PT Stop Time  1121    PT Time Calculation (min)  46 min       Past Medical History:  Diagnosis Date  . Ovarian cyst    right side    Past Surgical History:  Procedure Laterality Date  . OVARIAN CYST REMOVAL Right    at 34 yo    There were no vitals filed for this visit.  Subjective Assessment - 01/31/20 1040    Subjective  COVID-19 screening performed upon arrival. Patient reports about 3-4/10 LBP and Leg pain. No AD today. To see Ortho next Thursday    Pertinent History  unremarkable    Limitations  Sitting;Standing;Walking;House hold activities    How long can you stand comfortably?  short periods    How long can you walk comfortably?  short periods    Diagnostic tests  x-ray: see imaging    Currently in Pain?  Yes    Pain Score  4     Pain Location  Back    Pain Orientation  Right    Pain Descriptors / Indicators  Aching;Burning    Pain Type  Acute pain    Pain Onset  More than a month ago                       [facility] Adult PT Treatment/Exercise - 01/31/20 0001      Exercises   Exercises  Lumbar      Lumbar Exercises: Aerobic   Nustep  Level 2 x14 seat 8      Modalities   Modalities  Ultrasound      Ultrasound   Ultrasound Location  RT LB paras/upper glute    Ultrasound Parameters  1.5 w/cm2 x 12 mins    Ultrasound Goals  Pain      Manual Therapy   Manual Therapy  Soft tissue mobilization    Soft tissue mobilization   STW/M to right LB paras and SIJ ( less tender at glute) to reduce tone /pain in prone                   PT Long Term Goals - 01/09/20 1052      PT LONG TERM GOAL #1   Title  Patient will be independent with HEP    Time  6    Period  Weeks    Status  New      PT LONG TERM GOAL #2   Title  Patient will report a centralization of right sided neurological symptoms to low back or report no neurological symptoms to reduce nerve irritation.    Time  6    Period  Weeks    Status  New      PT LONG TERM GOAL #3   Title  Patient will report ability to perform ADLs and home activities with low back pain less than or equal to  4/10    Time  6    Period  Weeks    Status  New      PT LONG TERM GOAL #4   Title  Patient will return to exercise program with no right sided neurological symptoms and low back pain less than or equal to 4/10.    Time  6    Period  Weeks    Status  New            Plan - 01/31/20 1043    Clinical Impression Statement  Pt arrived today without AD, but mild antalgic gait pattern still. She reports doing a little better with decreased pain and able to walk better. Pt  had notable decreased tightness in LB paras and decreasede soreness in upper glute today    Personal Factors and Comorbidities  Age;Fitness;Time since onset of injury/illness/exacerbation    Examination-Activity Limitations  Bed Mobility;Transfers;Stand;Locomotion Level    Examination-Participation Restrictions  Cleaning;Meal Prep;Driving;Laundry    Stability/Clinical Decision Making  Evolving/Moderate complexity    Rehab Potential  Good    PT Frequency  2x / week    PT Duration  6 weeks    PT Treatment/Interventions  ADLs/Self Care Home Management;Gait training;Stair training;Functional mobility training;Therapeutic activities;Therapeutic exercise;Balance training;Neuromuscular re-education;Manual techniques;Moist Heat;Cryotherapy;Dentist;Patient/family education;Passive range of motion;Dry needling    PT Next Visit Plan  gentle core, STW/M to QL and glutes, when tolerable extension based TEs . Modalities PRN for pain relief.    Consulted and Agree with Plan of Care  Patient       Patient will benefit from skilled therapeutic intervention in order to improve the following deficits and impairments:     Visit Diagnosis: Acute right-sided low back pain with right-sided sciatica  Abnormal posture     Problem List Patient Active Problem List   Diagnosis Date Noted  . History of ovarian cystectomy 10/19/2017  . BMI 30.0-30.9,adult 10/19/2017    Daziyah Cogan,CHRIS, PTA 01/31/2020, 12:06 PM  [facility] [address] [place], [place], [address] Phone: [phone]   Fax:  [phone]  Name: Tabitha Trujillo MRN: [id number] Date of Birth: 12-14-1986

## 2020-02-01 ENCOUNTER — Ambulatory Visit: Payer: BC Managed Care – PPO | Admitting: *Deleted

## 2020-02-01 DIAGNOSIS — R293 Abnormal posture: Secondary | ICD-10-CM

## 2020-02-01 DIAGNOSIS — M5441 Lumbago with sciatica, right side: Secondary | ICD-10-CM | POA: Diagnosis not present

## 2020-02-01 NOTE — Therapy (Addendum)
[facility] [place], [place], [address] Phone: [phone]   Fax:  [phone]  Physical Therapy Treatment PHYSICAL THERAPY DISCHARGE SUMMARY  Visits from Start of Care: 6  Current functional level related to goals / functional outcomes: See below   Remaining deficits: See goals   Education / Equipment: HEP Plan: Patient agrees to discharge.  Patient goals were not met. Patient is being discharged due to not returning since the last visit.  ?????     Patient Details  Name: Tabitha Trujillo MRN: [id number] Date of Birth: [birth year] Referring Provider (PT): Darla Lesches, FNP-C   Encounter Date: 02/01/2020  PT End of Session - 02/01/20 1039    Visit Number  6    Number of Visits  12    Date for PT Re-Evaluation  02/27/20    Authorization Type  progress note every 10th visit    PT Start Time  1030    PT Stop Time  1121    PT Time Calculation (min)  51 min    Activity Tolerance  Patient limited by pain    Behavior During Therapy  [facility] for tasks assessed/performed       Past Medical History:  Diagnosis Date   Ovarian cyst    right side    Past Surgical History:  Procedure Laterality Date   OVARIAN CYST REMOVAL Right    at 34 yo    There were no vitals filed for this visit.  Subjective Assessment - 02/01/20 1038    Subjective  COVID-19 screening performed upon arrival. Patient reports about 3-4/10 LBP and Leg pain. . To see Ortho next Thursday    Pertinent History  unremarkable    Limitations  Sitting;Standing;Walking;House hold activities    How long can you stand comfortably?  short periods    How long can you walk comfortably?  short periods    Diagnostic tests  x-ray: see imaging    Currently in Pain?  Yes    Pain Score  4     Pain Location  Back    Pain Orientation  Right    Pain Descriptors / Indicators  Aching;Burning    Pain Type  Acute pain    Pain Onset  More than a month ago                        [facility] Adult PT Treatment/Exercise - 02/01/20 0001      Exercises   Exercises  Lumbar      Lumbar Exercises: Aerobic   Nustep  Level 2 x15 seat 8      Lumbar Exercises: Standing   Row  Strengthening;Both;10 reps;20 reps   3x10   Row Limitations  XTS green    Shoulder Extension  Strengthening;Both;20 reps;10 reps   3x10   Shoulder Extension Limitations  XTS green      Modalities   Modalities  Ultrasound      Ultrasound   Ultrasound Location  RT LB paras/glute    Ultrasound Parameters  1.5 w/cm2 x 10 mins    Ultrasound Goals  Pain      Manual Therapy   Manual Therapy  Soft tissue mobilization    Soft tissue mobilization  STW/M to right LB paras and SIJ ( less tender at glute) to reduce tone /pain in prone                   PT Long Term Goals - 01/09/20  [place] #1   Title  Patient will be independent with HEP    Time  6    Period  Weeks    Status  New      PT LONG TERM GOAL #2   Title  Patient will report a centralization of right sided neurological symptoms to low back or report no neurological symptoms to reduce nerve irritation.    Time  6    Period  Weeks    Status  New      PT LONG TERM GOAL #3   Title  Patient will report ability to perform ADLs and home activities with low back pain less than or equal to 4/10    Time  6    Period  Weeks    Status  New      PT LONG TERM GOAL #4   Title  Patient will return to exercise program with no right sided neurological symptoms and low back pain less than or equal to 4/10.    Time  6    Period  Weeks    Status  New              Patient will benefit from skilled therapeutic intervention in order to improve the following deficits and impairments:     Visit Diagnosis: Abnormal posture  Acute right-sided low back pain with right-sided sciatica     Problem List Patient Active Problem List   Diagnosis Date Noted   History of ovarian  cystectomy 10/19/2017   BMI 30.0-30.9,adult 10/19/2017    Issaac Shipper,CHRIS, PTA 02/01/2020, 12:38 PM  [facility] [address] [place], [place], [address] Phone: [phone]   Fax:  [phone]  Name: Tabitha Trujillo MRN: [id number] Date of Birth: 05-Jun-1986

## 2020-02-07 ENCOUNTER — Ambulatory Visit (INDEPENDENT_AMBULATORY_CARE_PROVIDER_SITE_OTHER): Payer: BC Managed Care – PPO | Admitting: Orthopaedic Surgery

## 2020-02-07 ENCOUNTER — Other Ambulatory Visit: Payer: Self-pay

## 2020-02-07 ENCOUNTER — Encounter: Payer: Self-pay | Admitting: Orthopaedic Surgery

## 2020-02-07 VITALS — BP 117/78 | HR 70 | Ht 63.0 in | Wt 167.0 lb

## 2020-02-07 DIAGNOSIS — M545 Low back pain, unspecified: Secondary | ICD-10-CM

## 2020-02-07 DIAGNOSIS — M5116 Intervertebral disc disorders with radiculopathy, lumbar region: Secondary | ICD-10-CM | POA: Insufficient documentation

## 2020-02-07 NOTE — Progress Notes (Signed)
Office Visit Note   Patient: Tabitha Trujillo           Date of Birth: Jan 01, 1986           MRN: [id number] Visit Date: 02/07/2020              Requested by: Sharion Balloon, [place],  [place] [address] PCP: Janora Norlander, DO   Assessment & Plan: Visit Diagnoses:  1. Acute right-sided low back pain, unspecified whether sciatica present   2. Intervertebral disc disorders with radiculopathy, lumbar region     Plan: Greater than 3 months history of radiculopathy with persistent weakness positive nerve root tension signs failed physical therapy, anti-inflammatories baclofen, chiropractic.  Shows right S1 weakness and needs MRI scan lumbar spine.  Office follow-up after scan for review.  Follow-Up Instructions: No follow-ups on file.   Orders:  Orders Placed This Encounter  Procedures  . MR Lumbar Spine w/o contrast   No orders of the defined types were placed in this encounter.     Procedures: No procedures performed   Clinical Data: No additional findings.   Subjective: Chief Complaint  Patient presents with  . 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  She does not recall a specific injury.  She has had to stop all workout activities.  She has aching and numbness difficulty walking.  She has 2 children she has had problems sleeping and has been on the couch for 3 weeks.  She denies chills or fever no associated bowel or bladder symptoms.  She has been through some physical therapy had multiple chiropractic treatments with Dr.Dabbs at [facility].  Baclofen gave her some relief.  No past history of disc herniation or degeneration.  Patient said some recent x-rays lumbar spine which showed mild lumbar curvature and facet arthropathy on the left at L5-S1.  Negative for spondylolisthesis negative for acute fracture.   She relates pain is moderate with bending or attempt to lift any object she states the pain is severe.  It bothers her on a daily basis she has problems sleeping.  Review of Systems 14 point review of systems positive for bronchitis and current back pain otherwise negative.  Non-smoker she has been exercising regularly until Thanksgiving onset of significant back and leg pain on the right.   Objective: Vital Signs: BP 117/78   Pulse 70   Ht 5\' 3"  (1.6 m)   Wt 167 lb (75.8 kg)   BMI 29.58 kg/m   Physical Exam Constitutional:      Appearance: She is well-developed.  HENT:     Head: Normocephalic.     Right Ear: External ear normal.     Left Ear: External ear normal.  Eyes:     Pupils: Pupils are equal, round, and reactive to light.  Neck:     Thyroid: No thyromegaly.     Trachea: No tracheal deviation.  Cardiovascular:     Rate and Rhythm: Normal rate.  Pulmonary:     Effort: Pulmonary effort is normal.  Abdominal:     Palpations: Abdomen is soft.  Skin:    General: Skin is warm and dry.  Neurological:     Mental Status: She is alert and oriented to person, place, and time.  Psychiatric:        Behavior: Behavior normal.  Ortho Exam patient has a slow deliberate gait.  Positive straight leg raising on the right at 70 degrees negative on the left.  Negative reverse straight leg raising.  Ankle jerk absent on the right otherwise 2+ knee and ankle jerk opposite side and 2+ knee jerk.  She is able to do single stance calf raises easily with the left easily fatigues out on the right after 3 slow repetitions.  No calf atrophy.  Anterior tib EHL is strong lateral foot numbness plantar foot numbness.  Specialty Comments:  No specialty comments available.  Imaging: No results found.   PMFS History: Patient Active Problem List   Diagnosis Date Noted  . Intervertebral disc disorders with radiculopathy, lumbar region 02/07/2020  . History of ovarian cystectomy 10/19/2017   . BMI 30.0-30.9,adult 10/19/2017   Past Medical History:  Diagnosis Date  . Ovarian cyst    right side    Family History  Problem Relation Age of Onset  . Heart disease Maternal Grandmother 60  . Hyperlipidemia Maternal Grandmother   . Hypertension Maternal Grandmother   . Diabetes Maternal Grandmother   . Alcohol abuse Paternal Grandmother   . Heart disease Paternal Grandfather     Past Surgical History:  Procedure Laterality Date  . OVARIAN CYST REMOVAL Right    at 34 yo   Social History   Occupational History  . Not on file  Tobacco Use  . Smoking status: Former Smoker    Packs/day: 0.50    Years: 5.00    Pack years: 2.50    Types: Cigarettes    Quit date: 2013    Years since quitting: 8.1  . Smokeless tobacco: Never Used  Substance and Sexual Activity  . Alcohol use: Yes    Comment: occassionally  . Drug use: No  . Sexual activity: Yes    Birth control/protection: Condom

## 2020-02-08 ENCOUNTER — Ambulatory Visit: Payer: BC Managed Care – PPO | Admitting: *Deleted

## 2020-02-11 ENCOUNTER — Encounter: Payer: Self-pay | Admitting: Orthopaedic Surgery

## 2020-02-11 DIAGNOSIS — M5127 Other intervertebral disc displacement, lumbosacral region: Secondary | ICD-10-CM | POA: Diagnosis not present

## 2020-02-11 DIAGNOSIS — M5416 Radiculopathy, lumbar region: Secondary | ICD-10-CM | POA: Diagnosis not present

## 2020-02-11 DIAGNOSIS — M545 Low back pain: Secondary | ICD-10-CM | POA: Diagnosis not present

## 2020-02-14 ENCOUNTER — Ambulatory Visit: Payer: BC Managed Care – PPO | Admitting: *Deleted

## 2020-02-14 ENCOUNTER — Telehealth: Payer: Self-pay | Admitting: Radiology

## 2020-02-14 ENCOUNTER — Ambulatory Visit: Payer: BC Managed Care – PPO | Admitting: Orthopaedic Surgery

## 2020-02-14 NOTE — Telephone Encounter (Signed)
Patient is unable to make appointment today. She requests call with MRI results.  CB [phone]

## 2020-02-14 NOTE — Telephone Encounter (Signed)
I called , she can come in next week

## 2020-02-19 ENCOUNTER — Ambulatory Visit: Payer: BC Managed Care – PPO | Admitting: *Deleted

## 2020-02-21 ENCOUNTER — Ambulatory Visit (INDEPENDENT_AMBULATORY_CARE_PROVIDER_SITE_OTHER): Payer: BC Managed Care – PPO | Admitting: Orthopaedic Surgery

## 2020-02-21 ENCOUNTER — Encounter: Payer: Self-pay | Admitting: Orthopaedic Surgery

## 2020-02-21 VITALS — BP 130/83 | HR 59 | Ht 63.0 in | Wt 167.0 lb

## 2020-02-21 DIAGNOSIS — M5116 Intervertebral disc disorders with radiculopathy, lumbar region: Secondary | ICD-10-CM

## 2020-02-25 NOTE — Progress Notes (Signed)
Office Visit Note   Patient: Tabitha Trujillo           Date of Birth: [birth year]           MRN: [id number] Visit Date: 02/21/2020              Requested by: Raliegh Ip, DO [address] [place],  [place] [address] PCP: Raliegh Ip, DO   Assessment & Plan: Visit Diagnoses:  1. Intervertebral disc disorders with radiculopathy, lumbar region     Plan: Patient's MRI scan was reviewed I gave her a copy of report.  She has a large right paracentral and subarticular disc protrusion with significant compression on the right descending S1 nerve root consistent with radiculopathy.  We discussed microdiscectomy versus epidural injection.  Her pain is eased some of the last several months but she has had persistent weakness.  We discussed microdiscectomy with outpatient procedure or overnight stay.  We will plan on checking her back in 3 weeks and we can discuss this further.  If she decides to proceed with operative intervention she will call and let us know.  Follow-Up Instructions: Return in about 3 weeks (around 03/13/2020).   Orders:  No orders of the defined types were placed in this encounter.  No orders of the defined types were placed in this encounter.     Procedures: No procedures performed   Clinical Data: No additional findings.   Subjective: Chief Complaint  Patient presents with  . Lower Back - Pain, Follow-up    MRI Lumbar Review    HPI 34 year old female with back and right leg pain since Thanksgiving.  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  She has aching pain difficulty walking problems sleeping.  She had chiropractic treatments anti-inflammatories baclofen topical rubs all without relief.  She is used stretching without improvement.  Review of Systems 14 point systems unchanged from 02/07/2020 other than as mentioned in HPI.   Objective: Vital Signs: BP 130/83   Pulse (!) 59   Ht 5\' 3"  (1.6  m)   Wt 167 lb (75.8 kg)   BMI 29.58 kg/m   Physical Exam Constitutional:      Appearance: She is well-developed.  HENT:     Head: Normocephalic.     Right Ear: External ear normal.     Left Ear: External ear normal.  Eyes:     Pupils: Pupils are equal, round, and reactive to light.  Neck:     Thyroid: No thyromegaly.     Trachea: No tracheal deviation.  Cardiovascular:     Rate and Rhythm: Normal rate.  Pulmonary:     Effort: Pulmonary effort is normal.  Abdominal:     Palpations: Abdomen is soft.  Skin:    General: Skin is warm and dry.  Neurological:     Mental Status: She is alert and oriented to person, place, and time.  Psychiatric:        Behavior: Behavior normal.     Ortho Exam patient is ambulating slow deliberate gait positive straight leg raising on the right at 60 degrees with positive popliteal compression test.  Negative contralateral straight leg raising.  Absent ankle jerk on the right.  She fatigues out with toe raises after 2 or 3 on the right can do multiple sets on the left without slowing.  No calf atrophy anterior tib and EHL is strong.  Patient has lateral foot numbness  and plantar foot numbness on the right side.  Specialty Comments:  No specialty comments available.  Imaging: EXAM: MRI LUMBAR SPINE WITHOUT CONTRAST  TECHNIQUE: Multiplanar, multisequence MR imaging of the lumbar spine was performed. No intravenous contrast was administered.  COMPARISON: Lumbar spine x-rays dated July 24, 2013.  FINDINGS: Segmentation: Standard.  Alignment: Trace retrolisthesis at L5-S1.  Vertebrae: No fracture, evidence of discitis, or bone lesion.  Conus medullaris and cauda equina: Conus extends to the L1 level. Conus and cauda equina appear normal.  Paraspinal and other soft tissues: Negative.  Disc levels:  T12-L1 to L3-L4: Negative.  L4-L5: Tiny shallow broad-based posterior disc protrusion with central annular fissure. No stenosis.  L5-S1: Large right paracentral  and subarticular disc protrusion with impingement of the descending right S1 nerve root. No spinal canal or neuroforaminal stenosis.  IMPRESSION: Large right paracentral and subarticular disc protrusion at L5-S1 with impingement of the descending right S1 nerve root.   Electronically Signed By: Titus Dubin M.D. On: 02/11/2020 17:56    PMFS History: Patient Active Problem List   Diagnosis Date Noted  . Intervertebral disc disorders with radiculopathy, lumbar region 02/07/2020  . History of ovarian cystectomy 10/19/2017  . BMI 30.0-30.9,adult 10/19/2017   Past Medical History:  Diagnosis Date  . Ovarian cyst    right side    Family History  Problem Relation Age of Onset  . Heart disease Maternal Grandmother 60  . Hyperlipidemia Maternal Grandmother   . Hypertension Maternal Grandmother   . Diabetes Maternal Grandmother   . Alcohol abuse Paternal Grandmother   . Heart disease Paternal Grandfather     Past Surgical History:  Procedure Laterality Date  . OVARIAN CYST REMOVAL Right    at 34 yo   Social History   Occupational History  . Not on file  Tobacco Use  . Smoking status: Former Smoker    Packs/day: 0.50    Years: 5.00    Pack years: 2.50    Types: Cigarettes    Quit date: 2013    Years since quitting: 8.1  . Smokeless tobacco: Never Used  Substance and Sexual Activity  . Alcohol use: Yes    Comment: occassionally  . Drug use: No  . Sexual activity: Yes    Birth control/protection: Condom

## 2020-03-13 ENCOUNTER — Ambulatory Visit: Payer: BC Managed Care – PPO | Admitting: Orthopaedic Surgery

## 2020-07-13 IMAGING — DX DG LUMBAR SPINE 2-3V
2 series · 2 of 2 positions shown · non-contrast
Comparison: None.

CLINICAL DATA: Low back pain

EXAM:
LUMBAR SPINE - 2-3 VIEW

[l-spine ap]
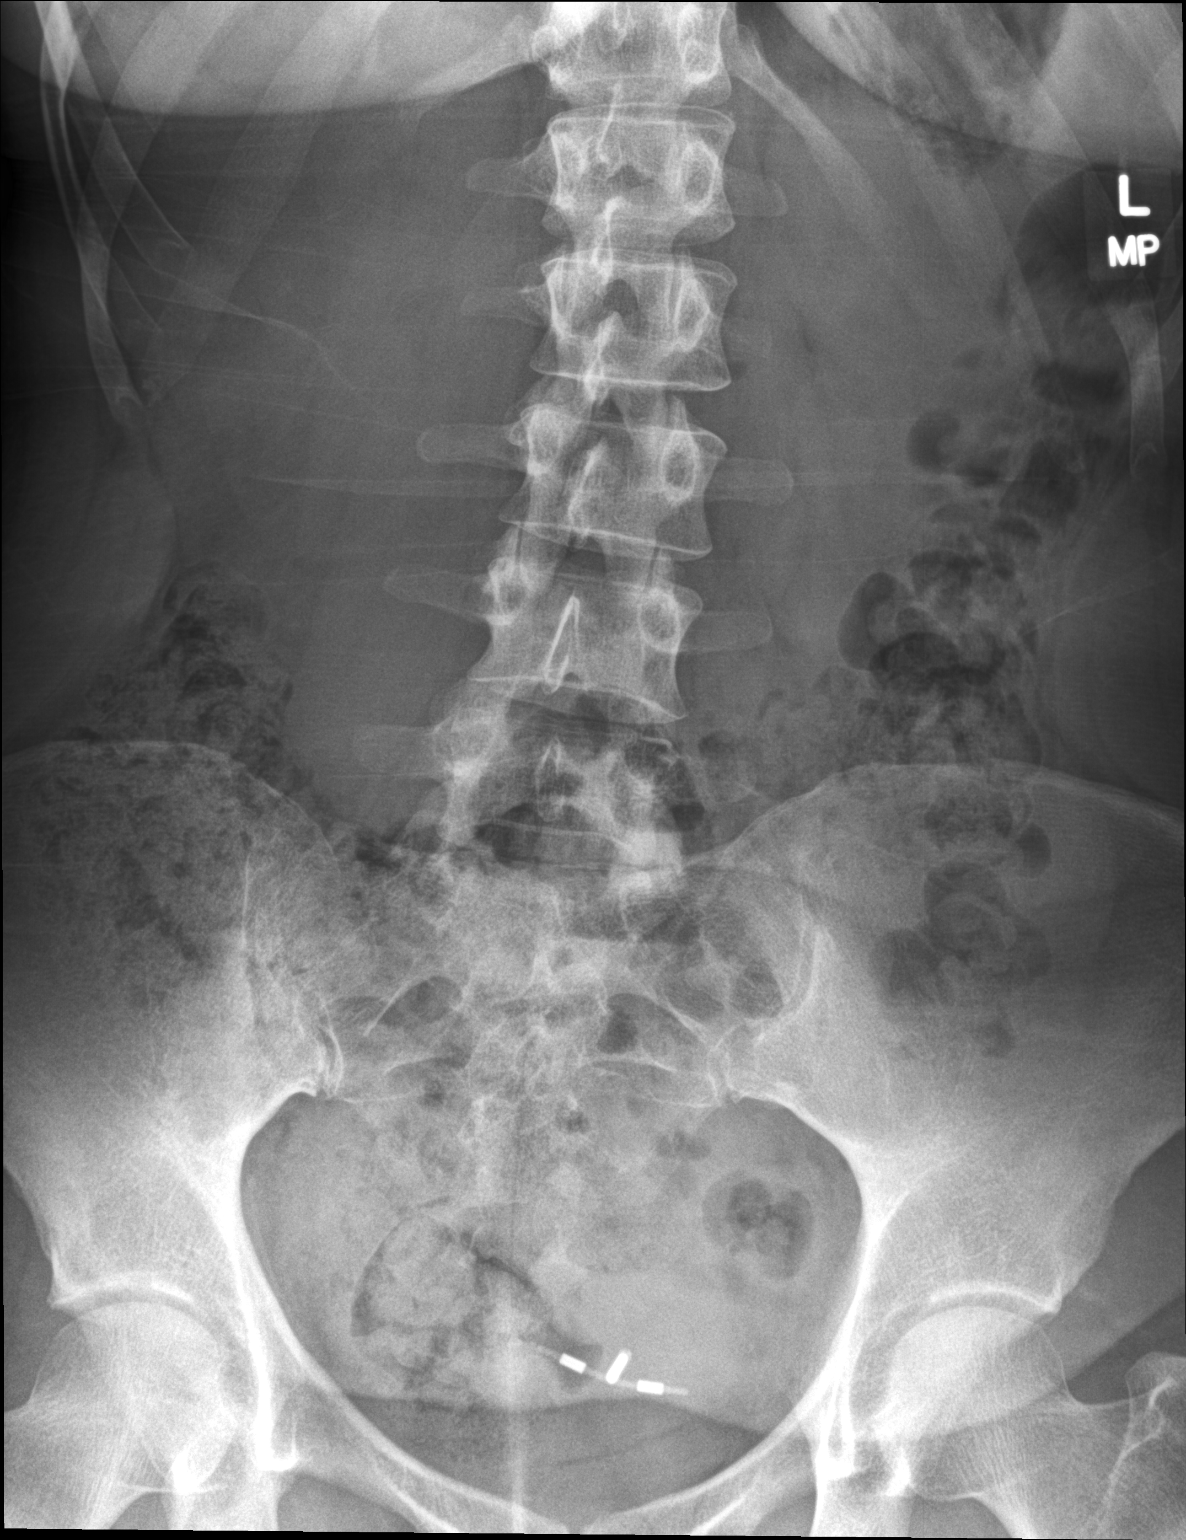

[l-spine lat]
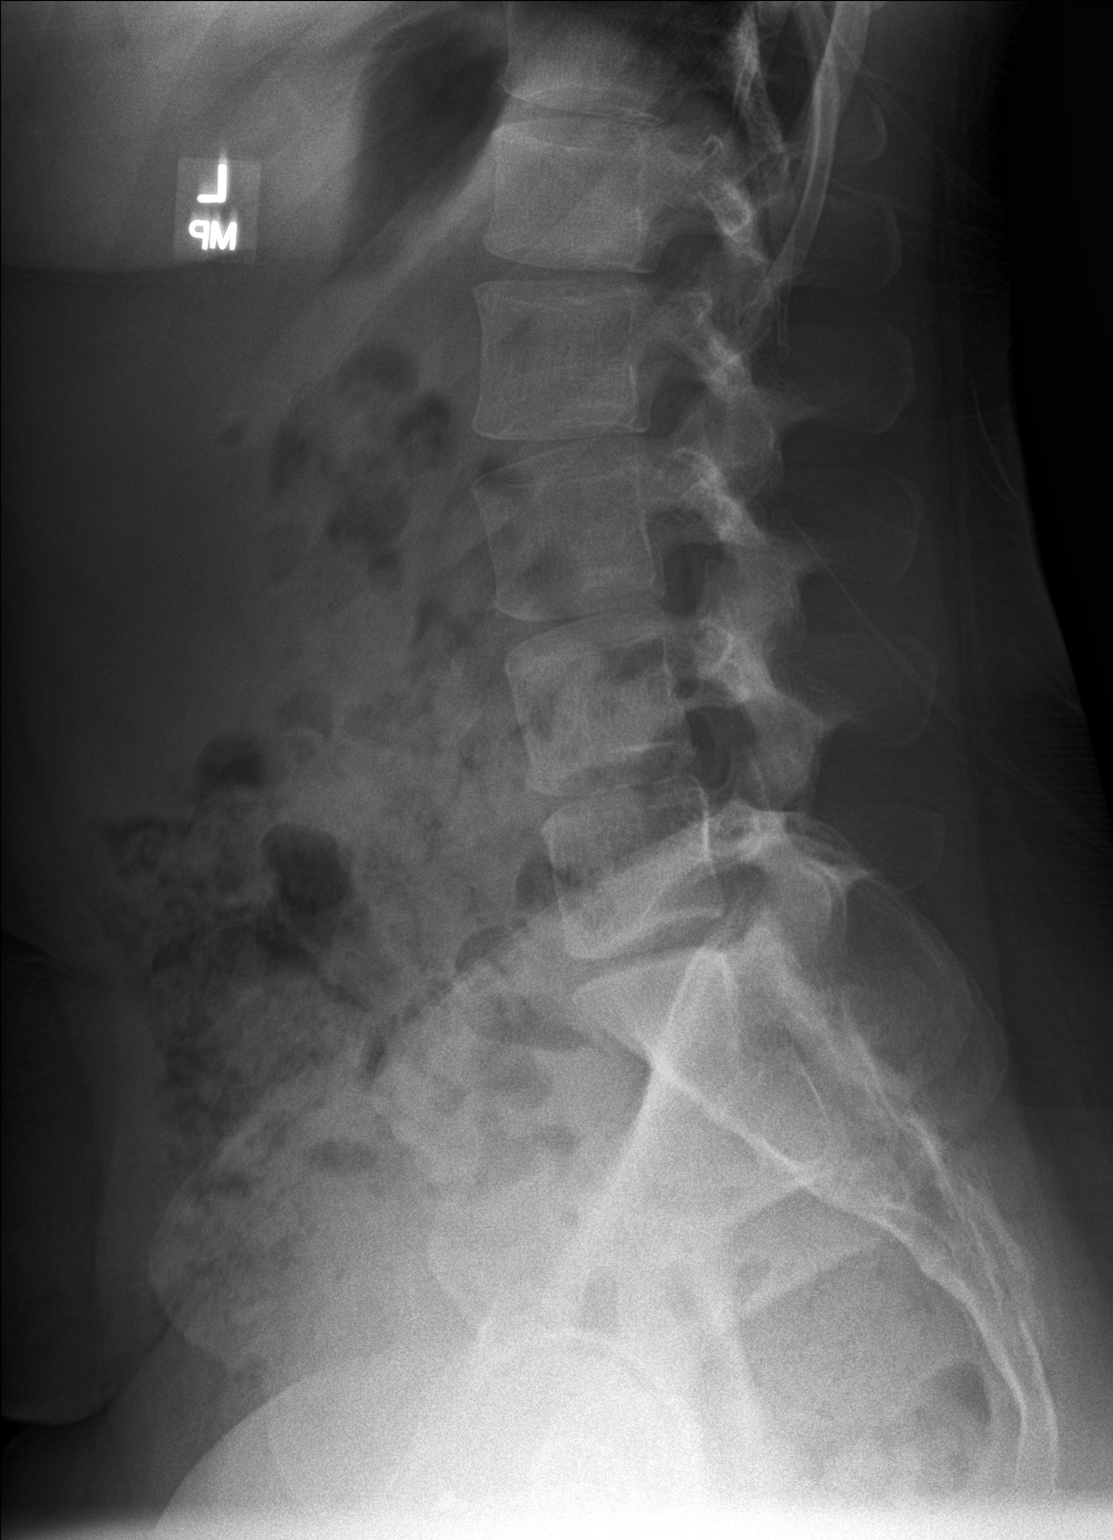

[2 of 2 positions shown; findings below may reference images not displayed]

FINDINGS: Frontal and lateral views were obtained. There are 5 non-rib-bearing
lumbar type vertebral bodies. There is lumbar levoscoliosis. There
is no fracture or spondylolisthesis. Disc spaces appear
unremarkable. There is facet hypertrophy on the left at L5-S1. No
erosive change.

There is an intrauterine device located in the pelvis.
IMPRESSION: Scoliosis. No fracture or spondylolisthesis. No appreciable disc
space narrowing. Facet hypertrophy noted on the left at L5-S1.

Intrauterine device in pelvis.

## 2020-09-10 ENCOUNTER — Other Ambulatory Visit: Payer: Self-pay | Admitting: Family

## 2020-09-10 DIAGNOSIS — M5441 Lumbago with sciatica, right side: Secondary | ICD-10-CM

## 2021-10-09 DIAGNOSIS — Z975 Presence of (intrauterine) contraceptive device: Secondary | ICD-10-CM | POA: Diagnosis not present

## 2021-10-09 DIAGNOSIS — N926 Irregular menstruation, unspecified: Secondary | ICD-10-CM | POA: Diagnosis not present

## 2021-10-09 DIAGNOSIS — Z6831 Body mass index (BMI) 31.0-31.9, adult: Secondary | ICD-10-CM | POA: Diagnosis not present

## 2021-11-23 DIAGNOSIS — Z1151 Encounter for screening for human papillomavirus (HPV): Secondary | ICD-10-CM | POA: Diagnosis not present

## 2021-11-23 DIAGNOSIS — Z01419 Encounter for gynecological examination (general) (routine) without abnormal findings: Secondary | ICD-10-CM | POA: Diagnosis not present

## 2021-11-23 DIAGNOSIS — Z6833 Body mass index (BMI) 33.0-33.9, adult: Secondary | ICD-10-CM | POA: Diagnosis not present

## 2022-04-22 DIAGNOSIS — M6281 Muscle weakness (generalized): Secondary | ICD-10-CM | POA: Diagnosis not present

## 2022-04-22 DIAGNOSIS — M5442 Lumbago with sciatica, left side: Secondary | ICD-10-CM | POA: Diagnosis not present

## 2022-04-22 DIAGNOSIS — N393 Stress incontinence (female) (male): Secondary | ICD-10-CM | POA: Diagnosis not present

## 2022-04-27 DIAGNOSIS — M6281 Muscle weakness (generalized): Secondary | ICD-10-CM | POA: Diagnosis not present

## 2022-04-27 DIAGNOSIS — M5442 Lumbago with sciatica, left side: Secondary | ICD-10-CM | POA: Diagnosis not present

## 2022-04-27 DIAGNOSIS — N393 Stress incontinence (female) (male): Secondary | ICD-10-CM | POA: Diagnosis not present

## 2022-04-30 DIAGNOSIS — M5442 Lumbago with sciatica, left side: Secondary | ICD-10-CM | POA: Diagnosis not present

## 2022-04-30 DIAGNOSIS — M6281 Muscle weakness (generalized): Secondary | ICD-10-CM | POA: Diagnosis not present

## 2022-04-30 DIAGNOSIS — N393 Stress incontinence (female) (male): Secondary | ICD-10-CM | POA: Diagnosis not present

## 2022-05-04 DIAGNOSIS — N393 Stress incontinence (female) (male): Secondary | ICD-10-CM | POA: Diagnosis not present

## 2022-05-04 DIAGNOSIS — M6281 Muscle weakness (generalized): Secondary | ICD-10-CM | POA: Diagnosis not present

## 2022-05-04 DIAGNOSIS — M5442 Lumbago with sciatica, left side: Secondary | ICD-10-CM | POA: Diagnosis not present

## 2022-05-06 DIAGNOSIS — N393 Stress incontinence (female) (male): Secondary | ICD-10-CM | POA: Diagnosis not present

## 2022-05-06 DIAGNOSIS — M5442 Lumbago with sciatica, left side: Secondary | ICD-10-CM | POA: Diagnosis not present

## 2022-05-06 DIAGNOSIS — M6281 Muscle weakness (generalized): Secondary | ICD-10-CM | POA: Diagnosis not present

## 2022-05-10 ENCOUNTER — Encounter: Payer: Self-pay | Admitting: Family Medicine

## 2022-05-10 ENCOUNTER — Ambulatory Visit (INDEPENDENT_AMBULATORY_CARE_PROVIDER_SITE_OTHER): Payer: BC Managed Care – PPO | Admitting: Family Medicine

## 2022-05-10 DIAGNOSIS — M5441 Lumbago with sciatica, right side: Secondary | ICD-10-CM | POA: Diagnosis not present

## 2022-05-10 MED ORDER — BACLOFEN 10 MG PO TABS: 10.0000 mg | ORAL_TABLET | Freq: Three times a day (TID) | ORAL | 1 refills | Status: DC
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
## 2022-05-11 DIAGNOSIS — M6281 Muscle weakness (generalized): Secondary | ICD-10-CM | POA: Diagnosis not present

## 2022-05-11 DIAGNOSIS — M5442 Lumbago with sciatica, left side: Secondary | ICD-10-CM | POA: Diagnosis not present

## 2022-05-11 DIAGNOSIS — N393 Stress incontinence (female) (male): Secondary | ICD-10-CM | POA: Diagnosis not present

## 2022-05-13 DIAGNOSIS — N393 Stress incontinence (female) (male): Secondary | ICD-10-CM | POA: Diagnosis not present

## 2022-05-13 DIAGNOSIS — M5442 Lumbago with sciatica, left side: Secondary | ICD-10-CM | POA: Diagnosis not present

## 2022-05-13 DIAGNOSIS — M6281 Muscle weakness (generalized): Secondary | ICD-10-CM | POA: Diagnosis not present

## 2022-05-19 DIAGNOSIS — M6281 Muscle weakness (generalized): Secondary | ICD-10-CM | POA: Diagnosis not present

## 2022-05-19 DIAGNOSIS — N393 Stress incontinence (female) (male): Secondary | ICD-10-CM | POA: Diagnosis not present

## 2022-05-19 DIAGNOSIS — M5442 Lumbago with sciatica, left side: Secondary | ICD-10-CM | POA: Diagnosis not present

## 2022-05-26 DIAGNOSIS — N393 Stress incontinence (female) (male): Secondary | ICD-10-CM | POA: Diagnosis not present

## 2022-05-26 DIAGNOSIS — M5442 Lumbago with sciatica, left side: Secondary | ICD-10-CM | POA: Diagnosis not present

## 2022-05-26 DIAGNOSIS — M6281 Muscle weakness (generalized): Secondary | ICD-10-CM | POA: Diagnosis not present

## 2022-06-02 DIAGNOSIS — M5442 Lumbago with sciatica, left side: Secondary | ICD-10-CM | POA: Diagnosis not present

## 2022-06-02 DIAGNOSIS — M6281 Muscle weakness (generalized): Secondary | ICD-10-CM | POA: Diagnosis not present

## 2022-06-02 DIAGNOSIS — N393 Stress incontinence (female) (male): Secondary | ICD-10-CM | POA: Diagnosis not present

## 2022-06-30 DIAGNOSIS — N393 Stress incontinence (female) (male): Secondary | ICD-10-CM | POA: Diagnosis not present

## 2022-06-30 DIAGNOSIS — M5442 Lumbago with sciatica, left side: Secondary | ICD-10-CM | POA: Diagnosis not present

## 2022-06-30 DIAGNOSIS — M6281 Muscle weakness (generalized): Secondary | ICD-10-CM | POA: Diagnosis not present

## 2022-10-15 ENCOUNTER — Other Ambulatory Visit: Payer: Self-pay | Admitting: Family Medicine

## 2022-10-15 DIAGNOSIS — M5441 Lumbago with sciatica, right side: Secondary | ICD-10-CM

## 2022-10-15 NOTE — Telephone Encounter (Signed)
Pt aware she needs an apt for future refills. She does not need refills at this time.

## 2022-10-15 NOTE — Telephone Encounter (Signed)
I have note seen this patient since 2018.  I will send in 1 RF but she is very overdue for physical exam/ labs, etc. Will need to be seen prior to more refills going forward.

## 2023-05-02 DIAGNOSIS — R21 Rash and other nonspecific skin eruption: Secondary | ICD-10-CM | POA: Diagnosis not present

## 2023-05-02 DIAGNOSIS — R030 Elevated blood-pressure reading, without diagnosis of hypertension: Secondary | ICD-10-CM | POA: Diagnosis not present

## 2023-05-02 DIAGNOSIS — Z6837 Body mass index (BMI) 37.0-37.9, adult: Secondary | ICD-10-CM | POA: Diagnosis not present

## 2023-05-02 DIAGNOSIS — E669 Obesity, unspecified: Secondary | ICD-10-CM | POA: Diagnosis not present

## 2023-05-25 DIAGNOSIS — N926 Irregular menstruation, unspecified: Secondary | ICD-10-CM | POA: Diagnosis not present

## 2023-06-13 DIAGNOSIS — N926 Irregular menstruation, unspecified: Secondary | ICD-10-CM | POA: Diagnosis not present

## 2023-08-02 ENCOUNTER — Telehealth: Payer: Self-pay | Admitting: Family Medicine

## 2023-08-02 NOTE — Telephone Encounter (Signed)
Pt called stating that her Epi Pen Rx is requiring PA at [facility] pharmacy.

## 2023-08-03 ENCOUNTER — Other Ambulatory Visit (HOSPITAL_COMMUNITY): Payer: Self-pay

## 2023-08-03 NOTE — Telephone Encounter (Signed)
Epi Pen is not on drug list and patient has not been seen since 2023. Please advise.

## 2023-08-03 NOTE — Telephone Encounter (Signed)
I have not seen this patient since 2019.  At minimum needs a virtual visit.  I think I might have one available tomorrow if needed.  Otherwise, please offer DOD

## 2023-08-03 NOTE — Telephone Encounter (Signed)
Attempted to call pt  - left vm - if pt calls back please get her a apt made

## 2023-09-08 ENCOUNTER — Encounter: Payer: Self-pay | Admitting: Family Medicine

## 2023-09-08 ENCOUNTER — Ambulatory Visit (INDEPENDENT_AMBULATORY_CARE_PROVIDER_SITE_OTHER): Payer: BC Managed Care – PPO | Admitting: Family Medicine

## 2023-09-08 VITALS — BP 113/59 | HR 88 | Temp 98.3°F | Ht 63.0 in | Wt 207.5 lb

## 2023-09-08 DIAGNOSIS — J069 Acute upper respiratory infection, unspecified: Secondary | ICD-10-CM

## 2023-09-08 DIAGNOSIS — J209 Acute bronchitis, unspecified: Secondary | ICD-10-CM

## 2023-09-08 MED ORDER — PREDNISONE 20 MG PO TABS
40.0000 mg | ORAL_TABLET | Freq: Every day | ORAL | 0 refills | Status: AC
Start: 2023-09-08 — End: 2023-09-13

## 2023-09-08 MED ORDER — AMOXICILLIN-POT CLAVULANATE 875-125 MG PO TABS
1.0000 | ORAL_TABLET | Freq: Two times a day (BID) | ORAL | 0 refills | Status: AC
Start: 2023-09-08 — End: 2023-09-15

## 2023-09-08 NOTE — Patient Instructions (Signed)

## 2023-09-08 NOTE — Progress Notes (Signed)
Acute Office Visit  Subjective:     Patient ID: Tabitha Trujillo, female    DOB: Aug 03, 1986, 37 y.o.   MRN: [id number]  Chief Complaint  Patient presents with   Nasal Congestion   Cough    Cough This is a new problem. Episode onset: 10 days or so. The problem has been unchanged. The cough is Non-productive. Associated symptoms include chest pain (soreness, tightness), nasal congestion (improved), postnasal drip (improved), a sore throat, shortness of breath (throughout the day) and wheezing. Pertinent negatives include no chills, ear congestion, ear pain, fever or headaches. She has tried OTC cough suppressant (nyquil cold and flu) for the symptoms. The treatment provided mild relief. Her past medical history is significant for bronchitis. There is no history of asthma or pneumonia.     Review of Systems  Constitutional:  Negative for chills and fever.  HENT:  Positive for postnasal drip (improved) and sore throat. Negative for ear pain.   Respiratory:  Positive for cough, shortness of breath (throughout the day) and wheezing.   Cardiovascular:  Positive for chest pain (soreness, tightness).  Neurological:  Negative for headaches.        Objective:    BP (!) 113/59   Pulse 88   Temp 98.3 F (36.8 C) (Temporal)   Ht 5\' 3"  (1.6 m)   Wt 207 lb 8 oz (94.1 kg)   SpO2 96%   BMI 36.76 kg/m    Physical Exam Vitals and nursing note reviewed.  Constitutional:      General: She is not in acute distress.    Appearance: She is ill-appearing. She is not toxic-appearing or diaphoretic.  HENT:     Head: Normocephalic and atraumatic.     Right Ear: Tympanic membrane, ear canal and external ear normal.     Left Ear: Tympanic membrane, ear canal and external ear normal.     Nose: Congestion present.     Mouth/Throat:     Mouth: Mucous membranes are moist.     Pharynx: Oropharynx is clear. Posterior oropharyngeal erythema present. No pharyngeal swelling or oropharyngeal  exudate.     Tonsils: No tonsillar exudate or tonsillar abscesses. 0 on the right. 0 on the left.  Eyes:     General:        Right eye: No discharge.        Left eye: No discharge.     Conjunctiva/sclera: Conjunctivae normal.  Cardiovascular:     Rate and Rhythm: Normal rate and regular rhythm.     Heart sounds: Normal heart sounds. No murmur heard. Pulmonary:     Effort: Pulmonary effort is normal. No respiratory distress.     Breath sounds: Normal breath sounds. No stridor. No wheezing, rhonchi or rales.  Chest:     Chest wall: No tenderness.  Musculoskeletal:     Cervical back: Neck supple. No rigidity.     Right lower leg: No edema.     Left lower leg: No edema.  Lymphadenopathy:     Cervical: No cervical adenopathy.  Skin:    General: Skin is warm and dry.  Neurological:     General: No focal deficit present.     Mental Status: She is alert and oriented to person, place, and time.  Psychiatric:        Mood and Affect: Mood normal.        Behavior: Behavior normal.     No results found for any visits on 09/08/23.  Assessment & Plan:   Tabitha Trujillo was seen today for nasal congestion and cough.  Diagnoses and all orders for this visit:  Acute URI Acute bronchitis, unspecified organism Lungs clear on exam today. Prednisone burst as below. Augmentin for empiric coverage of bacterial infection. Discussed symptomatic care and return precautions.  -     amoxicillin-clavulanate (AUGMENTIN) 875-125 MG tablet; Take 1 tablet by mouth 2 (two) times daily for 7 days. -     predniSONE (DELTASONE) 20 MG tablet; Take 2 tablets (40 mg total) by mouth daily with breakfast for 5 days.   Return if symptoms worsen or fail to improve.  The patient indicates understanding of these issues and agrees with the plan.  Gabriel Earing, FNP

## 2024-12-11 ENCOUNTER — Ambulatory Visit: Payer: Self-pay | Admitting: Family Medicine

## 2024-12-12 ENCOUNTER — Ambulatory Visit: Payer: Self-pay | Admitting: Family Medicine

## 2024-12-31 ENCOUNTER — Ambulatory Visit: Payer: Self-pay

## 2025-01-18 ENCOUNTER — Ambulatory Visit (INDEPENDENT_AMBULATORY_CARE_PROVIDER_SITE_OTHER): Payer: Self-pay | Admitting: Family Medicine

## 2025-01-18 ENCOUNTER — Encounter: Payer: Self-pay | Admitting: Family Medicine

## 2025-01-18 VITALS — BP 105/66 | HR 79 | Temp 98.3°F | Ht 63.0 in | Wt 216.4 lb

## 2025-01-18 DIAGNOSIS — L304 Erythema intertrigo: Secondary | ICD-10-CM | POA: Diagnosis not present

## 2025-01-18 DIAGNOSIS — Z789 Other specified health status: Secondary | ICD-10-CM | POA: Diagnosis not present

## 2025-01-18 DIAGNOSIS — E669 Obesity, unspecified: Secondary | ICD-10-CM | POA: Diagnosis not present

## 2025-01-18 DIAGNOSIS — Z23 Encounter for immunization: Secondary | ICD-10-CM | POA: Diagnosis not present

## 2025-01-18 DIAGNOSIS — N926 Irregular menstruation, unspecified: Secondary | ICD-10-CM | POA: Diagnosis not present

## 2025-01-18 DIAGNOSIS — Z91018 Allergy to other foods: Secondary | ICD-10-CM | POA: Diagnosis not present

## 2025-01-18 LAB — BAYER DCA HB A1C WAIVED: HB A1C (BAYER DCA - WAIVED): 4.7 % — ABNORMAL LOW (ref 4.8–5.6)

## 2025-01-18 MED ORDER — NYSTATIN 100000 UNIT/GM EX CREA: 1.0000 | TOPICAL_CREAM | Freq: Two times a day (BID) | CUTANEOUS | 2 refills | Status: AC

## 2025-01-18 NOTE — Patient Instructions (Signed)
 Intertrigo Intertrigo is skin irritation (inflammation) that happens in warm, moist areas of the body. The irritation can cause a rash and make skin raw and itchy. The rash is usually pink or red. It happens mostly between folds of skin or where skin rubs together, such as: In the armpits. Under the breasts. Under the belly. In the groin area. Around the butt area. Between the toes. This condition is not passed from person to person. What are the causes? Heat, moisture, rubbing, and not enough air movement. The condition can be made worse by: Sweat. Bacteria. A fungus, such as yeast. What increases the risk? Moisture in your skin folds. You are more likely to develop this condition if you: Are not able to move around. Live in a warm and moist climate. Are not able to control your pee (urine) or poop (stool). Wear splints, braces, or other medical devices. Are overweight. Have diabetes. What are the signs or symptoms? A pink or red skin rash in a skin fold or near a skin fold. Raw or scaly skin. Itching. A burning feeling. Bleeding. Leaking fluid. A bad smell. How is this treated? Cleaning and drying your skin. Taking an antibiotic medicine or using an antibiotic skin cream for a bacterial infection. Using an antifungal cream on your skin or taking pills for an infection that was caused by a fungus, such as yeast. Using a steroid ointment to stop the itching and irritation. Separating the skin fold with a clean cotton cloth to absorb moisture and allow air to flow into the area. Follow these instructions at home: Keep the affected area clean and dry. Do not scratch your skin. Stay cool as much as you can. Use an air conditioner or a fan, if you have one. Apply over-the-counter and prescription medicines only as told by your doctor. If you were prescribed antibiotics, use them as told by your doctor. Do not stop using the antibiotic even if you start to feel better. Keep all  follow-up visits. Your doctor may need to check your skin to make sure that the treatment is working. How is this prevented? Shower and dry yourself well after being active. Use a hair dryer on a cool setting to dry between skin folds. Do not wear tight clothes. Wear clothes that: Are loose. Take moisture away from your body. Are made of cotton. Wear a bra that gives good support, if needed. Protect the skin in your groin and butt area as told by your doctor. To do this: Follow a regular cleaning routine. Use creams, powders, or ointments that protect your skin. Change protection pads often. Stay at a healthy weight. Take care of your feet. This is very important if you have diabetes. You should: Wear shoes that fit well. Keep your feet dry. Wear clean cotton or wool socks. Keep your blood sugar under control if you have diabetes. Contact a doctor if: Your symptoms do not get better with treatment. Your symptoms get worse or they spread. You notice more redness and warmth. You have a fever. This information is not intended to replace advice given to you by your health care provider. Make sure you discuss any questions you have with your health care provider. Document Revised: 05/06/2022 Document Reviewed: 05/06/2022 Elsevier Patient Education  2024 [employer].

## 2025-01-18 NOTE — Progress Notes (Signed)
 "  Subjective: CC: Rash PCP: Jolinda Norene HERO, DO YEP:Djmp-Jobdz Spurr is a 39 y.o. female presenting to clinic today for:  She reports that she has been having intermittent issues for quite some time of a rash that occurs underneath her breasts.  Today she has an example under her left breast.  Sometimes it burns, sometimes it itches but it is not as bad as it normally is today.  She tries to keep the area dry by tucking her T-shirt underneath there but admits that she often does not wear any bras etc.  She reports no nipple discharge, pain etc.  No topicals used.  Also reports nausea, vomiting and diarrhea every time she eats any type of pork since 2022.  Does not recall any tick bites.  Denies any other symptoms including shortness of breath, perioral or mucosal swelling.  No rashes that occur with these episodes.  She has essentially just been avoiding pork type products.  Utilizes Benadryl if she is going out to eat just in case there is cross-contamination.  Not having any of these issues with red meat as far she knows.  Has avoided capsules   ROS: Per HPI  Allergies[1] Past Medical History:  Diagnosis Date   Ovarian cyst    right side   Ovarian cyst 2001   Current Medications[2] Social History   Socioeconomic History   Marital status: Unknown    Spouse name: Not on file   Number of children: Not on file   Years of education: Not on file   Highest education level: Not on file  Occupational History   Not on file  Tobacco Use   Smoking status: Former    Current packs/day: 0.00    Average packs/day: 0.5 packs/day for 5.0 years (2.5 ttl pk-yrs)    Types: Cigarettes    Start date: 09/06/2006    Quit date: 09/07/2011    Years since quitting: 13.3   Smokeless tobacco: Never  Vaping Use   Vaping status: Never Used  Substance and Sexual Activity   Alcohol use: Yes    Comment: occassionally   Drug use: Not on file   Sexual activity: Yes    Birth control/protection:  Condom  Other Topics Concern   Not on file  Social History Narrative   ** Merged History Encounter **       Social Drivers of Health   Tobacco Use: Medium Risk (01/18/2025)   Patient History    Smoking Tobacco Use: Former    Smokeless Tobacco Use: Never    Passive Exposure: Not on Actuary Strain: Not on file  Food Insecurity: Not on file  Transportation Needs: Not on file  Physical Activity: Not on file  Stress: Not on file  Social Connections: Not on file  Intimate Partner Violence: Not At Risk (05/25/2023)   [facility]   Epic    Within the last year, have you been afraid of your partner or ex-partner?: No    Within the last year, have you been humiliated or emotionally abused in other ways by your partner or ex-partner?: No    Within the last year, have you been kicked, hit, slapped, or otherwise physically hurt by your partner or ex-partner?: No    Within the last year, have you been raped or forced to have any kind of sexual activity by your partner or ex-partner?: No  Depression (PHQ2-9): Medium Risk (01/18/2025)   Depression (PHQ2-9)    PHQ-2 Score:  7  Alcohol Screen: Not on file  Housing: Not on file  Utilities: Not on file  Health Literacy: Low Risk (05/29/2024)   Received from [facility]   Health Literacy    : Never   Family History  Problem Relation Age of Onset   Cancer Paternal Aunt    Heart disease Maternal Grandmother 61   Alcohol abuse Maternal Grandmother    Alcohol abuse Paternal Grandfather    Hyperlipidemia Maternal Grandmother    Hypertension Maternal Grandmother    Diabetes Maternal Grandmother    Alcohol abuse Paternal Grandmother    Heart disease Paternal Grandfather     Objective: Office vital signs reviewed. BP 105/66   Pulse 79   Temp 98.3 F (36.8 C)   Ht 5' 3 (1.6 m)   Wt 216 lb 6 oz (98.1 kg)   SpO2 98%   BMI 38.33 kg/m   Physical Examination:  General: Awake, alert, well nourished, No  acute distress HEENT: sclera white, MMM Cardio: regular rate and rhythm, S1S2 heard, no murmurs appreciated Pulm: clear to auscultation bilaterally, no wheezes, rhonchi or rales; normal work of breathing on room air Skin: Rash that is slightly erythematous with a left regular raised borders and central clearing noted on the inferior aspect of the left medial breast.  No vesicular formation.  No exudates or bleeding  Assessment/ Plan: 39 y.o. female   Intertrigo - Plan: nystatin  cream (MYCOSTATIN )  Food allergy - Plan: Alpha-Gal Panel, Allergen, Pork, f26, CBC with Differential  Obesity (BMI 35.0-39.9 without comorbidity) - Plan: Lipid Panel, CMP14+EGFR, TSH + free T4, VITAMIN D 25 Hydroxy (Vit-D Deficiency, Fractures), Bayer DCA Hb A1c Waived  Irregular menses - Plan: TSH + free T4, CBC with Differential  Hepatitis B vaccination status unknown - Plan: Hepatitis B surface antibody,quantitative   Rashes clinically consistent with intertrigo.  Discussed keeping area dry.  Nystatin  cream given for as needed use.  Given what sounds like a pork allergy I will check her for alpha gal and do a pork panel on her.  Check CBC as well.  Discussed utilization of Pepcid  Lipid panel, CMP, thyroid  levels, vitamin D and A1c checked given BMI in obese range.  She had reported some irregular menstrual cycles.  Has IUD in place for contraception.  Will check thyroid  levels  Uncertain vaccination status against hepatitis B so we will check titers and vaccinate accordingly.  Tetanus shot in the meantime was administered.  Release of information form for cervical cancer screening completed   Deontez Klinke M Ermalee Mealy, DO [facility] [phone]     [1]  Allergies Allergen Reactions   Azithromycin Nausea And Vomiting   Demerol [Meperidine] Nausea And Vomiting   Demerol [Meperidine] Nausea And Vomiting   Dilaudid [Hydromorphone]    Zithromax [Azithromycin] Nausea And Vomiting  and Rash  [2]  Current Outpatient Medications:    nystatin  cream (MYCOSTATIN ), Apply 1 Application topically 2 (two) times daily. X7-10 days per rash flare, Disp: 30 g, Rfl: 2   PARAGARD  INTRAUTERINE COPPER  IU, by Intrauterine route., Disp: , Rfl:   "

## 2025-01-22 LAB — ALPHA-GAL PANEL
Allergen Lamb IgE: <0.1 kU/L
Beef IgE: <0.1 kU/L
IgE (Immunoglobulin E), Serum: 33 [IU]/mL (ref 6–495)
O215-IgE Alpha-Gal: <0.1 kU/L
Pork IgE: <0.1 kU/L

## 2025-01-22 LAB — CBC WITH DIFFERENTIAL/PLATELET
Basophils Absolute: 0 10*3/uL (ref 0.0–0.2)
Basos: 1 %
EOS (ABSOLUTE): 0.2 10*3/uL (ref 0.0–0.4)
Eos: 2 %
Hematocrit: 41.4 % (ref 34.0–46.6)
Hemoglobin: 13.8 g/dL (ref 11.1–15.9)
Immature Grans (Abs): 0 10*3/uL (ref 0.0–0.1)
Immature Granulocytes: 0 %
Lymphocytes Absolute: 1.9 10*3/uL (ref 0.7–3.1)
Lymphs: 30 %
MCH: 30.3 pg (ref 26.6–33.0)
MCHC: 33.3 g/dL (ref 31.5–35.7)
MCV: 91 fL (ref 79–97)
Monocytes Absolute: 0.6 10*3/uL (ref 0.1–0.9)
Monocytes: 10 %
Neutrophils Absolute: 3.6 10*3/uL (ref 1.4–7.0)
Neutrophils: 57 %
Platelets: 307 10*3/uL (ref 150–450)
RBC: 4.55 x10E6/uL (ref 3.77–5.28)
RDW: 12 % (ref 11.7–15.4)
WBC: 6.3 10*3/uL (ref 3.4–10.8)

## 2025-01-22 LAB — CMP14+EGFR
ALT: 10 [IU]/L (ref 0–32)
AST: 15 [IU]/L (ref 0–40)
Albumin: 4.2 g/dL (ref 3.9–4.9)
Alkaline Phosphatase: 66 [IU]/L (ref 41–116)
BUN/Creatinine Ratio: 9 (ref 9–23)
BUN: 9 mg/dL (ref 6–20)
Bilirubin Total: 0.4 mg/dL (ref 0.0–1.2)
CO2: 21 mmol/L (ref 20–29)
Calcium: 9.2 mg/dL (ref 8.7–10.2)
Chloride: 104 mmol/L (ref 96–106)
Creatinine, Ser: 0.95 mg/dL (ref 0.57–1.00)
Globulin, Total: 2.6 g/dL (ref 1.5–4.5)
Glucose: 67 mg/dL — ABNORMAL LOW (ref 70–99)
Potassium: 3.8 mmol/L (ref 3.5–5.2)
Sodium: 138 mmol/L (ref 134–144)
Total Protein: 6.8 g/dL (ref 6.0–8.5)
eGFR: 79 mL/min/{1.73_m2}

## 2025-01-22 LAB — LIPID PANEL
Chol/HDL Ratio: 2.3 ratio (ref 0.0–4.4)
Cholesterol, Total: 177 mg/dL (ref 100–199)
HDL: 77 mg/dL
LDL Chol Calc (NIH): 88 mg/dL (ref 0–99)
Triglycerides: 61 mg/dL (ref 0–149)
VLDL Cholesterol Cal: 12 mg/dL (ref 5–40)

## 2025-01-22 LAB — TSH+FREE T4
Free T4: 1.11 ng/dL (ref 0.82–1.77)
TSH: 0.994 u[IU]/mL (ref 0.450–4.500)

## 2025-01-22 LAB — HEPATITIS B SURFACE ANTIBODY, QUANTITATIVE: Hepatitis B Surf Ab Quant: <3.5 m[IU]/mL — ABNORMAL LOW

## 2025-01-22 LAB — VITAMIN D 25 HYDROXY (VIT D DEFICIENCY, FRACTURES): Vit D, 25-Hydroxy: 23.8 ng/mL — ABNORMAL LOW (ref 30.0–100.0)

## 2025-01-23 ENCOUNTER — Ambulatory Visit: Payer: Self-pay | Admitting: Family Medicine

## 2025-02-04 ENCOUNTER — Ambulatory Visit

## 2025-03-01 ENCOUNTER — Ambulatory Visit

## 2026-01-20 ENCOUNTER — Encounter: Admitting: Family Medicine
